# Patient Record
Sex: Female | Born: 1950 | Race: White | Hispanic: No | Smoking: Former smoker
Health system: Southern US, Community
[De-identification: ages and names within clinical notes are randomized; demographics above are authoritative.]

## PROBLEM LIST (undated history)

## (undated) DIAGNOSIS — D3613 Benign neoplasm of peripheral nerves and autonomic nervous system of lower limb, including hip: Secondary | ICD-10-CM

## (undated) DIAGNOSIS — I340 Nonrheumatic mitral (valve) insufficiency: Secondary | ICD-10-CM

## (undated) DIAGNOSIS — E039 Hypothyroidism, unspecified: Secondary | ICD-10-CM

## (undated) DIAGNOSIS — Z87442 Personal history of urinary calculi: Secondary | ICD-10-CM

## (undated) DIAGNOSIS — Q210 Ventricular septal defect: Secondary | ICD-10-CM

## (undated) DIAGNOSIS — H9319 Tinnitus, unspecified ear: Secondary | ICD-10-CM

## (undated) DIAGNOSIS — IMO0002 Reserved for concepts with insufficient information to code with codable children: Secondary | ICD-10-CM

## (undated) DIAGNOSIS — M21619 Bunion of unspecified foot: Secondary | ICD-10-CM

## (undated) DIAGNOSIS — J302 Other seasonal allergic rhinitis: Secondary | ICD-10-CM

## (undated) DIAGNOSIS — K5909 Other constipation: Secondary | ICD-10-CM

## (undated) DIAGNOSIS — K5790 Diverticulosis of intestine, part unspecified, without perforation or abscess without bleeding: Secondary | ICD-10-CM

## (undated) DIAGNOSIS — M1990 Unspecified osteoarthritis, unspecified site: Secondary | ICD-10-CM

## (undated) DIAGNOSIS — R200 Anesthesia of skin: Secondary | ICD-10-CM

## (undated) HISTORY — PX: BREAST LUMPECTOMY: SHX2

## (undated) HISTORY — DX: Diverticulosis of intestine, part unspecified, without perforation or abscess without bleeding: K57.90

## (undated) HISTORY — DX: Hypothyroidism, unspecified: E03.9

## (undated) HISTORY — DX: Reserved for concepts with insufficient information to code with codable children: IMO0002

## (undated) HISTORY — DX: Ventricular septal defect: Q21.0

## (undated) HISTORY — PX: ABDOMINAL HYSTERECTOMY: SHX81

## (undated) HISTORY — PX: SHOULDER ARTHROSCOPY: SHX128

## (undated) HISTORY — PX: TONSILLECTOMY: SUR1361

## (undated) HISTORY — DX: Other constipation: K59.09

## (undated) HISTORY — DX: Anesthesia of skin: R20.0

---

## 1994-01-29 HISTORY — PX: BREAST EXCISIONAL BIOPSY: SUR124

## 2003-02-03 ENCOUNTER — Ambulatory Visit (HOSPITAL_COMMUNITY): Admission: RE | Admit: 2003-02-03 | Discharge: 2003-02-03 | Payer: Self-pay | Admitting: Internal Medicine

## 2003-02-03 HISTORY — PX: COLONOSCOPY: SHX174

## 2003-11-22 ENCOUNTER — Encounter: Admission: RE | Admit: 2003-11-22 | Discharge: 2003-11-22 | Payer: Self-pay | Admitting: Unknown Physician Specialty

## 2003-12-28 ENCOUNTER — Ambulatory Visit: Payer: Self-pay | Admitting: Urgent Care

## 2004-02-08 ENCOUNTER — Ambulatory Visit: Payer: Self-pay | Admitting: Internal Medicine

## 2004-03-23 ENCOUNTER — Ambulatory Visit: Payer: Self-pay | Admitting: Internal Medicine

## 2004-05-09 ENCOUNTER — Ambulatory Visit: Payer: Self-pay | Admitting: Internal Medicine

## 2004-05-10 ENCOUNTER — Encounter: Admission: RE | Admit: 2004-05-10 | Discharge: 2004-05-10 | Payer: Self-pay | Admitting: Unknown Physician Specialty

## 2005-04-18 ENCOUNTER — Ambulatory Visit: Payer: Self-pay | Admitting: Cardiology

## 2005-05-14 ENCOUNTER — Encounter: Admission: RE | Admit: 2005-05-14 | Discharge: 2005-05-14 | Payer: Self-pay | Admitting: Unknown Physician Specialty

## 2006-06-12 ENCOUNTER — Encounter: Admission: RE | Admit: 2006-06-12 | Discharge: 2006-06-12 | Payer: Self-pay | Admitting: Family Medicine

## 2007-05-01 ENCOUNTER — Ambulatory Visit: Payer: Self-pay | Admitting: Internal Medicine

## 2007-06-18 ENCOUNTER — Ambulatory Visit: Payer: Self-pay | Admitting: Internal Medicine

## 2007-07-15 ENCOUNTER — Encounter: Admission: RE | Admit: 2007-07-15 | Discharge: 2007-07-15 | Payer: Self-pay | Admitting: Family Medicine

## 2007-09-03 DIAGNOSIS — R141 Gas pain: Secondary | ICD-10-CM

## 2007-09-03 DIAGNOSIS — R142 Eructation: Secondary | ICD-10-CM

## 2007-09-03 DIAGNOSIS — R110 Nausea: Secondary | ICD-10-CM

## 2007-09-03 DIAGNOSIS — E039 Hypothyroidism, unspecified: Secondary | ICD-10-CM | POA: Insufficient documentation

## 2007-09-03 DIAGNOSIS — K5900 Constipation, unspecified: Secondary | ICD-10-CM | POA: Insufficient documentation

## 2007-09-03 DIAGNOSIS — R143 Flatulence: Secondary | ICD-10-CM

## 2007-09-03 DIAGNOSIS — Z9189 Other specified personal risk factors, not elsewhere classified: Secondary | ICD-10-CM | POA: Insufficient documentation

## 2008-05-13 ENCOUNTER — Ambulatory Visit: Payer: Self-pay | Admitting: Internal Medicine

## 2008-05-19 ENCOUNTER — Ambulatory Visit: Payer: Self-pay | Admitting: Internal Medicine

## 2008-07-26 ENCOUNTER — Encounter: Admission: RE | Admit: 2008-07-26 | Discharge: 2008-07-26 | Payer: Self-pay | Admitting: Family Medicine

## 2009-08-08 ENCOUNTER — Encounter: Admission: RE | Admit: 2009-08-08 | Discharge: 2009-08-08 | Payer: Self-pay | Admitting: Family Medicine

## 2009-08-29 ENCOUNTER — Ambulatory Visit: Payer: Self-pay | Admitting: Internal Medicine

## 2009-08-30 ENCOUNTER — Ambulatory Visit: Payer: Self-pay | Admitting: Cardiology

## 2009-08-31 ENCOUNTER — Ambulatory Visit: Payer: Self-pay | Admitting: Internal Medicine

## 2010-02-28 NOTE — Assessment & Plan Note (Signed)
Summary: dropped off stool/ss   Pt returned one iFOBT and it was negative.     Allergies: No Known Drug Allergies  Other Orders: Immuno-chemical Fecal Occult (16109)  Appended Document: dropped off stool/ss Let pt know ifobt negative.  Recommend f/u as needed.  Appended Document: dropped off stool/ss pt aware

## 2010-02-28 NOTE — Assessment & Plan Note (Signed)
Summary: rx refill,bowel changes and bloating/ss   Visit Type:  Follow-up Visit Primary Care Provider:  Selinda Flavin  Chief Complaint:  Rx/ changes in BM and bloating.  History of Present Illness: [name] is here for f/u. She has h/o chronic constipation. She is here for RX refills and for recent change in BM  BM regular on Miralax. Usually two soft stools per day. For two weeks, she would feel constipated for few days and then explosive BM for few days. Stools now back to normal. Over six months in winter, am nausea. Better after BM and after meals better. Last one week not present. No change in medications. No heartburn. No melena, brbpr. Rare sharp pain in lower abd which would last for seconds. Good appetite. Weight up five pounds.   Ventricular valve defect. Going for stress test tomorrow.   Labwork last week looked good including B12.   Current Medications (verified): 1)  Singulair 10 Mg  Tabs (Montelukast Sodium) .... Once Daily 2)  Osteobiflex .... Once Daily 3)  Synthroid 75 Mcg Tabs (Levothyroxine Sodium) .... Take 1 Tablet By Mouth Once A Day 4)  Flaxseed Oil 1000 Mg  Caps (Flaxseed (Linseed)) .... Take 1 Tablet By Mouth Once A Day 5)  Calcium D .... Once Daily 6)  Miralax .... Twice Every Other Day, Once The Ohter Days 7)  Sustenex .... Once Daily 8)  Multi-Viatmin .... Once Daily 9)  Zyrtec Allergy 10 Mg Tabs (Cetirizine Hcl) .... Take 1 Tablet By Mouth Once A Day 10)  Vitamin E .... Once Daily 11)  Fish Oil 1200 Mg .... One Tablet Daily 12)  Adrenal Support .... One Tablet 3 Times  Daily  Allergies (verified): No Known Drug Allergies  Past History:  Past Medical History: Hypothyroidism Allergies Chronic constipation  Family History: Father: Deceased @ 42  Result of a fall Mother: Deceased @ 69 Parkinson's Siblings: 2 One deceased  unknown No FH of CRC.  Sister, colon polyps, late 29s.  Social History: Marital Status: Married Children: 3 Occupation:  Part time [employer], basically retired. Nonsmoker. Rare wine.  Review of Systems      See HPI GU:  Denies urinary burning and blood in urine.  Vital Signs:  Patient profile:   60 year old female Height:      62 inches Weight:      125 pounds BMI:     22.95 Temp:     97.8 degrees F oral Pulse rate:   80 / minute BP sitting:   100 / 74  (left arm) Cuff size:   regular  Vitals Entered By: Cloria Spring LPN (August 29, 2009 10:32 AM)  Physical Exam  General:  Well developed, well nourished, no acute distress. Head:  Normocephalic and atraumatic. Eyes:  sclera nonicteric Mouth:  op moist Lungs:  Clear throughout to auscultation. Heart:  Regular rate and rhythm; no murmurs, rubs,  or bruits. Abdomen:  Bowel sounds normal.  Abdomen is soft, nontender, nondistended.  No rebound or guarding.  No hepatosplenomegaly, masses or hernias.  No abdominal bruits.  Extremities:  No clubbing, cyanosis, edema or deformities noted. Neurologic:  Alert and  oriented x4;  grossly normal neurologically. Skin:  Intact without significant lesions or rashes. Psych:  Alert and cooperative. Normal mood and affect.  Impression & Recommendations:  Problem # 1:  Hx of CONSTIPATION (ICD-564.00)  Chronic constipation controlled with Miralax. Two week h/o more constipation followed by loose explosive stool. Last TCS 2005, due for  f/u 2015. No blood in stool. No need for TCS at this time. Would advise she continue to monitor BM. If she has change in bowel habits that is prolonged then she will let me know. Will check ifobt. Request labs from PCP for review.  Orders: Est. Patient Level II (16109)  Problem # 2:  Hx of NAUSEA (ICD-787.02)  Vague am nausea for past six months. Improved with eating and BM. ?significance. Trial of PPI at night if symptoms return. Samples of Prilosec OTC #30 given. IFOBT. She will call if symptoms do not respond to PPI.   Orders: Est. Patient Level II  (60454) Prescriptions: POLYETHYLENE GLYCOL 3350  POWD (POLYETHYLENE GLYCOL 3350) 17 grams by mouth daily, alternating with 17 grams by mouth bid  #1054 grams x 11   Entered and Authorized by:   Leanna Battles. Dixon Boos   Signed by:   Leanna Battles Lis Savitt PA-C on 08/29/2009   Method used:   Electronically to        [facility]  [address] [address] [id number] (retail)       [address]       [place], [place]  [address]       Ph: [phone] or [phone]       Fax: [phone]   RxID:   [id number]   Appended Document: rx refill,bowel changes and bloating/ss Reviewed labs 7/11 at PCP: B12 649, Folate >19.9, LFTs normal, Met-7, WBC 4.4, H/H 13.7/41.1, MCV 99, Plt 208,000, TSH 1.890

## 2010-06-13 NOTE — Assessment & Plan Note (Signed)
[name], [name]                 CHART#:  [id number]   DATE:  05/01/2007                       DOB:  12-Jun-1950   REASON FOR CONSULTATION:  Abdominal pain, bloating, nausea.   REASON FOR VISIT:  A 60 year old lady with abdominal pain, constipation,  has had some increased difficulties with bloating and nausea and  rumbling, which is loud enough which makes her hesitant to go to  church, and some early-morning nausea these days.  She has been taking  MiraLax 17 g orally in the morning regularly for constipation, but she  does, basically, move her bowels a couple of times daily.  On imaging  studies previously, she has always had a large stool load in her colon.  She took a brief course of Prilosec and probiotic therapy recently, and  the bloating and nausea have actually significantly improved.  She has  not had any melena or hematochezia.  Her weight has been stable.  She  gets nauseous, but she never vomits.  Her bowel function has been such  as it is all of her life.  There is no family history of GI neoplasia.  She has had some intermittent left lower quadrant abdominal pain for  years, this was evaluated fairly extensively back in 2005.  She has been  on a variety of agents for constipation.  She has a history of  hypothyroidism, but she tells me Dr. Dimas Aguas feels that she is adequately  replaced these days.  She underwent a colonoscopy, largely for  screening, in January of 2005.  She was found to have a normal rectum  and colon.   PAST MEDICAL HISTORY:  1. Hypothyroidism.  2. Allergies.   PAST SURGERIES:  1. Hysterectomy.  2. Lumpectomy.  3. Tonsillectomy.   She did see her gynecologist recently and did a vaginal ultrasound.  Left ovary difficult to see because of the large amount of stool in her  rectum and left colon.   MEDICATIONS:  See updated list.   ALLERGIES:  No known drug allergies.   SOCIAL HISTORY:  The patient is married.  She rarely has a glass of  wine, no tobacco, no illicit drugs.   REVIEW OF SYSTEMS:  No chest pain, no dyspnea on exertion, no change in  weight, no fever or chills.  No odynophagia, dysphagia, or reflux  symptoms.   PHYSICAL EXAMINATION:  A pleasant 60 year old lady who appears at her  baseline weight 123, height 5 feet 2 inches, temp 98, BP 190/60 and  pulse 56.  SKIN:  Warm and dry.  There is no jaundice.  CHEST:  Lungs are clear to auscultation.  CARDIAC EXAM:  Regular rate and rhythm without murmur, gallop, or rub.  BREAST EXAM:  Deferred.  ABDOMEN:  Flat, positive bowel sounds, soft, really nontender to  palpation.  No appreciable mass or organomegaly.  EXTREMITIES:  Show no edema.  RECTAL EXAM:  No external lesions.  Digital exam reveals good tone.  She  has quite a bit of soft stool in proximal rectum.  No mass.  Stool is  Hemoccult negative.   IMPRESSION:  The patient is a very pleasant 60 year old lady with an  element of chronic left-sided abdominal pain who has had some self-  limiting bloating and nausea recently, although she did take a course of  acid suppression and probiotic therapy, which, at least, is temporarily  related to improvement in those symptoms.   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  I would like to see if we  cannot get her colon better evacuated on a regular basis, and see if we  cannot, overall, get her feeling better.   RECOMMENDATIONS:  I have told the patient if she takes a single dose of  MiraLax daily, I would like her to take it at bedtime, but for the time  being I would actually like to increase the dose to 17 g orally twice  daily and see how this works for her.  It should increase volume and  number of stools somewhat, and see if we can get her feeling better.   I do not feel that any laboratory studies or diagnostic  studies,  otherwise, are indicated at this time.  Will plan to see her back in the  office and reevaluate in the near future.   She is to keep a stool diary.       Jonathon Bellows, M.D.  Electronically Signed     RMR/MEDQ  D:  05/02/2007  T:  05/02/2007  Job:  [id number]   cc:   Selinda Flavin

## 2010-06-13 NOTE — Assessment & Plan Note (Signed)
NAME:  RAEDEN, BELZER                 CHART#:  [id number]   DATE:                                   DOB:  1950/08/29   SUBJECTIVE:  [name] presented with recurrent left-sided abdominal  pain back on May 01, 2007.  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  We got her back on a regimen of MiraLax  once nightly when she was seen.  On May 02, 2007, she called in and  stated that she was about 25% better.  She tried MiraLax twice daily and  it was a little bit too much.  She dropped back to once daily but will  go two to three days without a bowel movement and still had some  abdominal bloating, but it was improved.  I added the second dose every  third day, to see if we could facilitate normalization of bowel  function.  She is here today, pleased that the abdominal bloating and  left-sided abdominal pain were much better.  She has one to two bowel  movements daily and feels that her overall stool volume has picked up,  although she is not having any diarrhea.  She assessed herself as being  over 50% improved.  She was started on Sustenex daily as a probiotic as  well, which may also be improving the situation.  She has not had any  rectal bleeding and overall feels much better.  She wishes she could  have completely normal bowel function.  Her weight is down 2 pounds  since she was last seen.   CURRENT MEDICATIONS:  See the updated list.   ALLERGIES:  No known drug allergies.   PHYSICAL EXAMINATION:  GENERAL:  She appears very well, a well-groomed  lady, in no acute distress.  VITAL SIGNS:  Weight 121 pounds, height 5 feet 2 inches, temperature  97.7 degrees, blood pressure 118/80, pulse 60.  ABDOMEN:  Nondistended.  Positive bowel sounds, soft, entirely nontender  without appreciable mass or organomegaly.   ASSESSMENT:  Slow transit constipation/colonic inertia, much improved  with the current regimen of MiraLax.   PLAN:  We could probably try to titrate  further, to ideally meet the  challenges of her slow colon.  I told Ms. Schmiesing I will give her the  liberty to go ahead and take a second dose of MiraLax every other day  instead of every third day, and see if that won't additionally  facilitate normalization of bowel function and minimization of her  current GI symptoms.  If this turns out to be too much, as a regular  twice daily regimen, she can back off to the second dose every third  day.   FOLLOWUP:  I would like to see this nice lady back in three months and  see how she is doing.       Jonathon Bellows, M.D.  Electronically Signed     RMR/MEDQ  D:  06/18/2007  T:  06/18/2007  Job:  [id number]   cc:   Lonzo Cloud

## 2010-06-16 NOTE — Op Note (Signed)
NAME:  [name], [name]                          ACCOUNT NO.:  [id number]   MEDICAL RECORD NO.:  [id number]                   PATIENT TYPE:  AMB   LOCATION:  DAY                                  FACILITY:  [facility]   PHYSICIAN:  R. Roetta Sessions, M.D.              DATE OF BIRTH:  26-Dec-1950   DATE OF PROCEDURE:  02/03/2003  DATE OF DISCHARGE:                                 OPERATIVE REPORT   PROCEDURE:  Diagnostic colonoscopy.   ENDOSCOPIST:  Gerrit Friends. Rourk, M.D.   INDICATIONS FOR PROCEDURE:  The patient is a 60 year old lady with a 2-year  history of left lower quadrant abdominal pain.  She has not had prior  colorectal cancer screening.  She is sent over at the courtesy of Dr. Mora Appl  in [place].  Colonoscopy is now being performed. This approach has been  discussed with the patient at length previously.  The potential risks,  benefits, and alternatives have been reviewed; questions answered.  She is  agreeable.  Please see my dictated consultation of January 25, 2003 for  more information.   PROCEDURE NOTE:  O2 saturation, blood pressure, pulse and respirations were  monitored throughout the entire procedure.  Conscious sedation: Versed 4 mg IV, Demerol 50 mg IV, ampicillin 2 gm IV,  gentamicin IV prior to the procedure.   INSTRUMENT:  Olympus video chip pediatric colonoscope.   FINDINGS:  Digital rectal exam revealed no abnormalities.   ENDOSCOPIC FINDINGS:  The prep was good.   RECTUM:  Examination of the rectal mucosa including the retroflex view of  the anal verge revealed no abnormalities.   COLON:  The colonic mucosa was surveyed from the rectosigmoid junction  through the left transverse and right colon to the area of the appendiceal  orifice, ileocecal valve, and cecum.  These structures were well seen and  photographed for the record.   From this level the scope was slowly withdrawn.  All previously mentioned  mucosal surfaces were again seen.  No other colonic  mucosal abnormalities  were observed.  The patient tolerated the procedure well and was reacted in  endoscopy.   IMPRESSION:  1. Normal rectum.  2. Normal colon.   RECOMMENDATIONS:  1. No endoscopic explanation for the patient's symptoms.  I understand that     she has not had a CT scan, as of yet, this really needs to be done.  2. We will proceed with an abdominal pelvic CT scan in the near future.  3. Further recommendations to follow.  4. As well as colorectal cancer screening is concerned, with no family     history she is to consider having another colonoscopy in 10 years.      ___________________________________________  Jonathon Bellows, M.D.   RMR/MEDQ  D:  02/03/2003  T:  02/03/2003  Job:  [id number]   cc:   Ruthy Dick  [address]  [place]  [place] [address]  Fax: [phone]

## 2010-06-16 NOTE — H&P (Signed)
NAME:  [name], [name] NO.:  [id number]   MEDICAL RECORD NO.:  [id number]                  PATIENT TYPE:   LOCATION:                                       FACILITY:   PHYSICIAN:  R. Roetta Sessions, M.D.              DATE OF BIRTH:  10-Dec-1950   DATE OF ADMISSION:  01/25/2003  DATE OF DISCHARGE:                                HISTORY & PHYSICAL   CHIEF COMPLAINT:  Left lower quadrant pain.   HPI:  [name] is a 60 year old Caucasian female who presented to  our office for evaluation of left lower quadrant abdominal pain for two  years.  [name] states that she has had twinges of increasing soreness  in her left lower quadrant.  This has been intermittent over the last two  years, however progressively over the last three months it has gotten worse.  It usually is present every day and may disappear for an hour or two  intermittently during the day.  She also complains of nausea without any  emesis.  She denies any diarrhea.  She does report occasional constipation,  usually once or twice a month, and has taken laxatives three times last  year.  She reports bowel movements every day or every other day without any  hematochezia or melena.  She was seen by Dr. Gilford [name] and is status post  partial hysterectomy in 1981.  She also states that CA-125 was checked which  was normal.  Of note, she denies any upper GI symptoms including heartburn,  indigestion or history of GERD or peptic ulcer disease.   PAST MEDICAL HISTORY:  1. SEASONAL ALLERGIES.  2. Anxiety.  3. Congenital heart defect with murmur.  4. Hormone replacement therapy.   PAST SURGICAL HISTORY:  1. Partial hysterectomy in 1981.  2. Tonsillectomy at age 6.   CURRENT MEDICATIONS:  1. OsteoBiflex daily.  2. Vitamin E and C daily.  3. Allergy injections twice a week.  4. Multivitamin.  5. Motrin 100 mg daily.  6. Flonase daily.  7. Singulair 10 mg daily.  8. Cenestin 0.625 mg  daily.   ALLERGIES:  No known drug allergies.   FAMILY HISTORY:  No known family history of colorectal carcinoma, liver or  chronic GI problems.  Father deceased at age 39 and did have a history of  gastric carcinoma diagnosed at age 31.  Mother is age 73 and in fair health.  She has one sister alive and one brother deceased secondary to unknown  cause.   SOCIAL HISTORY:  [name] has been married for 35 years and has three  grown healthy children.  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  She currently does not smoke and reports remote  history of three years of tobacco use.  She currently consumes one to two  glasses of wine per month.  She denies any drug use.   REVIEW OF SYSTEMS:  CONSTITUTIONAL:  Weight is stable, appetite is good.  Denies any fever or chills.  Denies any fatigue.  CARDIOVASCULAR:  No chest  pain or palpitations.  PULMONARY:  No history of asthma or COPD.  Denies any  shortness of breath, dyspnea or hemoptysis.  SKIN:  Denies any rash or  jaundice.  HEME:  Denies any history of anemia or blood dyscrasias.  GI:  See HPI.  Also denies any dysphagia or odynophagia.  ENDOCRINE:  Denies any  history of diabetes mellitus or thyroid problems.   PHYSICAL EXAMINATION:  VITAL SIGNS:  Weight of 119 pounds, height 62 inches,  temp 97.4, blood pressure 130/82, pulse 54.  GENERAL:  [name] is a 60 year old Caucasian female who is well-  developed, well-nourished, in no apparent distress.  She is alert, oriented,  pleasant and cooperative.  HEENT:  Sclera clear, nonicteric.  Conjunctiva pink.  Throat is pink and  moist without any lesions.  NECK:  Supple without any mass or thyromegaly.  CHEST:  Heart regular rate and rhythm with a 3/6 murmur.  LUNGS:  Clear to auscultation bilaterally.  ABDOMEN:  Flat with positive bowel sounds x 4.  Soft, nontender,  nondistended.  No palpable organomegaly or mass.  EXTREMITIES:  2+ pupils  bilaterally, no edema.  SKIN:  Pink, warm and dry without any rash or jaundice.   ASSESSMENT:  [name] is a 60 year old Caucasian female with persistent  left lower quadrant pain of approximately two years now:  Definitely  worrisome as [name] has not had screening colonoscopy and is age 89.  Would recommend colonoscopy at this point in time to rule out colon cancer.  Would consider other diagnoses including diverticulitis or atypical  irritable bowel syndrome with constipation if colonoscopy is negative.   RECOMMENDATIONS:  1. Will schedule screening colonoscopy to be performed by Dr. Jena Gauss.  I have     discussed this procedure including risks and benefits which include but     are not limited to bleeding, perforation and infection with [name].     She agrees with this plan.  We will also give SBE prophylaxis given her     history of congenital heart defect and murmur.  2. Labs today did include CBC and sed rate.  3. Recommended over-the-counter stool softeners as well as fiber     supplementation to increase stool to bulk help decrease incidence of     constipation.   We would like to thank Dr. Mora Appl for allowing [place] to participate in the care  of [name].     _____________________________________  ___________________________________________  Nicholas Lose, N.P.               Jonathon Bellows, M.D.   KC/MEDQ  D:  01/25/2003  T:  01/25/2003  Job:  [id number]   cc:   R. Roetta Sessions, M.D.  [address]  [place]  [place] [address]  Fax: [phone]   Dr. Mora Appl  [facility]

## 2010-07-17 ENCOUNTER — Other Ambulatory Visit: Payer: Self-pay | Admitting: Family Medicine

## 2010-07-17 DIAGNOSIS — Z1231 Encounter for screening mammogram for malignant neoplasm of breast: Secondary | ICD-10-CM

## 2010-08-22 ENCOUNTER — Ambulatory Visit
Admission: RE | Admit: 2010-08-22 | Discharge: 2010-08-22 | Disposition: A | Discharge status: Home / Self Care | Payer: BC Managed Care – PPO | Source: Ambulatory Visit | Attending: Family Medicine | Admitting: Family Medicine

## 2010-08-22 DIAGNOSIS — Z1231 Encounter for screening mammogram for malignant neoplasm of breast: Secondary | ICD-10-CM

## 2010-08-23 ENCOUNTER — Ambulatory Visit: Payer: Self-pay

## 2010-09-15 ENCOUNTER — Other Ambulatory Visit: Payer: Self-pay | Admitting: Gastroenterology

## 2011-07-27 ENCOUNTER — Other Ambulatory Visit: Payer: Self-pay | Admitting: Family Medicine

## 2011-07-27 DIAGNOSIS — Z1231 Encounter for screening mammogram for malignant neoplasm of breast: Secondary | ICD-10-CM

## 2011-08-28 ENCOUNTER — Ambulatory Visit: Payer: BC Managed Care – PPO

## 2011-09-10 ENCOUNTER — Ambulatory Visit
Admission: RE | Admit: 2011-09-10 | Discharge: 2011-09-10 | Disposition: A | Discharge status: Home / Self Care | Payer: BC Managed Care – PPO | Source: Ambulatory Visit | Attending: Family Medicine | Admitting: Family Medicine

## 2011-09-10 DIAGNOSIS — Z1231 Encounter for screening mammogram for malignant neoplasm of breast: Secondary | ICD-10-CM

## 2011-10-15 ENCOUNTER — Other Ambulatory Visit: Payer: Self-pay | Admitting: Internal Medicine

## 2011-10-15 NOTE — Telephone Encounter (Signed)
LMOM to call back

## 2011-10-15 NOTE — Telephone Encounter (Signed)
Please let pt know she needs OV prior to further RFs as not seen in 2 years.  She can get from PCP otherwise.

## 2011-12-11 ENCOUNTER — Ambulatory Visit: Payer: BC Managed Care – PPO | Admitting: Urgent Care

## 2011-12-13 ENCOUNTER — Ambulatory Visit (INDEPENDENT_AMBULATORY_CARE_PROVIDER_SITE_OTHER): Payer: BC Managed Care – PPO | Admitting: Gastroenterology

## 2011-12-13 ENCOUNTER — Encounter: Payer: Self-pay | Admitting: Gastroenterology

## 2011-12-13 VITALS — BP 120/60 | HR 65 | Temp 98.2°F | Ht 62.0 in | Wt 127.8 lb

## 2011-12-13 DIAGNOSIS — K5900 Constipation, unspecified: Secondary | ICD-10-CM

## 2011-12-13 MED ORDER — LUBIPROSTONE 8 MCG PO CAPS: 8.0000 ug | ORAL_CAPSULE | Freq: Two times a day (BID) | ORAL | Status: DC

## 2011-12-13 MED ORDER — POLYETHYLENE GLYCOL 3350 17 GM/SCOOP PO POWD: 17.0000 g | Freq: Every day | ORAL | Status: DC

## 2011-12-13 NOTE — Patient Instructions (Addendum)
Start taking Amitiza each night with food. You may increase this to twice a day with a meal if it works well. We have provided samples to try.   Please call us back in a week to let us know how you are doing.   We will see you back in 2 years or sooner if necessary. We will set you up for a colonoscopy at that time.

## 2011-12-13 NOTE — Progress Notes (Signed)
.  Primary Care Physician:  Selinda Flavin, MD Primary GI: Dr. Jena Gauss   Chief Complaint  Patient presents with  . Medication Refill    HPI:   [name] is a 61 year old female who presents today for a 2 year f/u with hx of chronic constipation. Last TCS in 2005, due for screening in 2015. Notes taking Miralax daily, sometimes twice a day. Sometimes bloated, sometimes feels not as productive as it should. She then increases the Miralax to twice a day. Trying to eat a high fiber diet, doesn't utilize fiber supplements. No blood in stool. No wt loss or lack of appetite. No reflux, no nausea. Taking a probiotic daily  Past Medical History  Diagnosis Date  . Hypothyroidism   . Chronic constipation     Past Surgical History  Procedure Date  . Abdominal hysterectomy   . Breast lumpectomy   . Tonsillectomy   . Colonoscopy  02/03/2003    RMR: Normal rectum and colon    Current Outpatient Prescriptions  Medication Sig Dispense Refill  . ALPRAZolam (XANAX) 0.25 MG tablet Take 0.25 mg by mouth at bedtime as needed.       . CENESTIN 0.625 MG tablet Take 0.625 mg by mouth daily.       . fish oil-omega-3 fatty acids 1000 MG capsule Take 2 g by mouth daily.      . Flaxseed, Linseed, (FLAX SEED OIL PO) Take by mouth.      . levocetirizine (XYZAL) 5 MG tablet Take 5 mg by mouth every evening.       Marland Kitchen levothyroxine (SYNTHROID, LEVOTHROID) 75 MCG tablet Take 75 mcg by mouth daily.       . montelukast (SINGULAIR) 10 MG tablet Take 10 mg by mouth at bedtime.       . Multiple Vitamin (MULTIVITAMIN) capsule Take 1 capsule by mouth daily.      . polyethylene glycol powder (GLYCOLAX/MIRALAX) powder Take 17 g by mouth daily.  255 g  11  . vitamin B-12 (CYANOCOBALAMIN) 1000 MCG tablet Take 1,000 mcg by mouth daily.      . Vitamin D, Ergocalciferol, (DRISDOL) 50000 UNITS CAPS Take 50,000 Units by mouth.      . lubiprostone (AMITIZA) 8 MCG capsule Take 1 capsule (8 mcg total) by mouth 2 (two) times daily  with a meal.  14 capsule  0    Allergies as of 12/13/2011 - Review Complete 12/13/2011  Allergen Reaction Noted  . Sulfa antibiotics Rash 12/13/2011    Family History  Problem Relation Age of Onset  . Colon cancer Neg Hx     History   Social History  . Marital Status: Married    Spouse Name: N/A    Number of Children: N/A  . Years of Education: N/A   Occupational History  . retired    Social History Main Topics  . Smoking status: Former Smoker -- 0.5 packs/day    Types: Cigarettes  . Smokeless tobacco: None     Comment: smoked in her 5s  . Alcohol Use: Yes     Comment: socially  . Drug Use: No  . Sexually Active: None   Other Topics Concern  . None   Social History Narrative  . None    Review of Systems: Gen: Denies fever, chills, anorexia. Denies fatigue, weakness, weight loss.  CV: Denies chest pain, palpitations, syncope, peripheral edema, and claudication. Resp: Denies dyspnea at rest, cough, wheezing, coughing up blood, and pleurisy. GI: Denies vomiting blood, jaundice,  and fecal incontinence.   Denies dysphagia or odynophagia. Derm: Denies rash, itching, dry skin Psych: Denies depression, anxiety, memory loss, confusion. No homicidal or suicidal ideation.  Heme: Denies bruising, bleeding, and enlarged lymph nodes.  Physical Exam: BP 120/60  Pulse 65  Temp 98.2 F (36.8 C) (Temporal)  Ht 5\' 2"  (1.575 m)  Wt 127 lb 12.8 oz (57.97 kg)  BMI 23.38 kg/m2 General:   Alert and oriented. No distress noted. Pleasant and cooperative.  Head:  Normocephalic and atraumatic. Eyes:  Conjuctiva clear without scleral icterus. Mouth:  Oral mucosa pink and moist. Good dentition. No lesions. Neck:  Supple, without mass or thyromegaly. Heart:  S1, S2 present without murmurs, rubs, or gallops. Regular rate and rhythm. Abdomen:  +BS, soft, non-tender and non-distended. No rebound or guarding. No HSM or masses noted. Msk:  Symmetrical without gross deformities. Normal  posture. Extremities:  Without edema. Neurologic:  Alert and  oriented x4;  grossly normal neurologically. Skin:  Intact without significant lesions or rashes. Cervical Nodes:  No significant cervical adenopathy. Psych:  Alert and cooperative. Normal mood and affect.

## 2011-12-16 ENCOUNTER — Encounter: Payer: Self-pay | Admitting: Gastroenterology

## 2011-12-16 NOTE — Assessment & Plan Note (Signed)
61 year old female with hx of chronic constipation, due for screening colonoscopy in 2015. Routine follow-up today for refills. Denies any rectal bleeding. Notes occasional bloating, feelings of incomplete evacuation. Miralax 1-2 times daily. Will trial Amitiza 8 mcg daily and increase to BID if improvement. Pt to call in 1 week with PR.   Otherwise, return in 2015 to set up TCS.

## 2011-12-17 ENCOUNTER — Telehealth: Payer: Self-pay | Admitting: Internal Medicine

## 2011-12-17 NOTE — Telephone Encounter (Signed)
Pt was seen recently and was asking to speak with LSL regarding her medications. Please call her at [phone] before 1230 if possible or either later this afternoon

## 2011-12-17 NOTE — Telephone Encounter (Signed)
Pt saw AS at her ov, not LSL. Pt stated she has been taking the amitiza qd and it doesn't seem to be working and she only had nausea 1 time since she started taking it. Advised pt to increase to bid per AS ov note and let us know how its working and make sure she takes it with food.

## 2011-12-17 NOTE — Progress Notes (Signed)
Faxed to PCP

## 2011-12-19 NOTE — Telephone Encounter (Signed)
Agree 

## 2011-12-25 ENCOUNTER — Other Ambulatory Visit: Payer: Self-pay | Admitting: Gastroenterology

## 2012-01-07 ENCOUNTER — Other Ambulatory Visit: Payer: Self-pay

## 2012-01-07 MED ORDER — POLYETHYLENE GLYCOL 3350 17 GM/SCOOP PO POWD: 17.0000 g | Freq: Every day | ORAL | Status: DC

## 2012-08-13 ENCOUNTER — Other Ambulatory Visit: Payer: Self-pay

## 2012-08-13 DIAGNOSIS — Z1231 Encounter for screening mammogram for malignant neoplasm of breast: Secondary | ICD-10-CM

## 2012-09-10 ENCOUNTER — Ambulatory Visit
Admission: RE | Admit: 2012-09-10 | Discharge: 2012-09-10 | Disposition: A | Discharge status: Home / Self Care | Payer: BC Managed Care – PPO | Source: Ambulatory Visit

## 2012-09-10 DIAGNOSIS — Z1231 Encounter for screening mammogram for malignant neoplasm of breast: Secondary | ICD-10-CM

## 2012-10-21 ENCOUNTER — Encounter: Payer: Self-pay | Admitting: Cardiology

## 2012-10-22 ENCOUNTER — Ambulatory Visit (INDEPENDENT_AMBULATORY_CARE_PROVIDER_SITE_OTHER): Payer: BC Managed Care – PPO | Admitting: Cardiology

## 2012-10-22 ENCOUNTER — Encounter: Payer: Self-pay | Admitting: Cardiology

## 2012-10-22 VITALS — BP 144/82 | HR 59 | Ht 62.0 in | Wt 130.1 lb

## 2012-10-22 DIAGNOSIS — Q210 Ventricular septal defect: Secondary | ICD-10-CM

## 2012-10-22 DIAGNOSIS — E039 Hypothyroidism, unspecified: Secondary | ICD-10-CM

## 2012-10-22 NOTE — Patient Instructions (Addendum)
Your physician recommends that you schedule a follow-up appointment in: 1 year. You will receive a reminder letter in the mail in about 10 months reminding you to call and schedule your appointment. If you don't receive this letter, please contact our office. Your physician recommends that you continue on your current medications as directed. Please refer to the Current Medication list given to you today. Your physician has requested that you have an echocardiogram in 1 year just before your next visit. Echocardiography is a painless test that uses sound waves to create images of your heart. It provides your doctor with information about the size and shape of your heart and how well your heart's chambers and valves are working. This procedure takes approximately one hour. There are no restrictions for this procedure.  

## 2012-10-22 NOTE — Progress Notes (Signed)
Clinical Summary [name] is a 62 y.o.female referred for cardiology consultation by Dr. Dimas Aguas. She is the mother of Dr. Egbert Garibaldi, Pulmonologist in [place]. History reviewed below, including prior documentation of a small membranous VSD by echocardiogram in 2011. She reports long-standing cardiac murmur as expected, has been aware that she had a "hole in her heart" since childhood. She has never required any specific intervention for this. She reports no functional limitations.  Dr. Jeannette How records indicate prior documentation of cardiac murmur over the years. Recent echocardiogram read by the [facility] reported normal LV wall thickness with LVEF 60-65%, grade 1 diastolic dysfunction, normal RV size and contraction, membranous VSD with left to right shunting, normal right atrial chamber size, no definite ASD or PFO, trace aortic regurgitation, mild tricuspid regurgitation with RVSP 50-55 mm mercury, no pericardial effusion.  ECG today shows sinus bradycardia.  She states that she exercises 2 hours a day, walks for an hour, and then goes to the [facility] for water aerobics and weights. She reports NYHA class I dyspnea, no chest pain or palpitations. She has no prior history of endocarditis.  Allergies  Allergen Reactions  . Sulfa Antibiotics Rash    Current Outpatient Prescriptions  Medication Sig Dispense Refill  . ALPRAZolam (XANAX) 0.25 MG tablet Take 0.25 mg by mouth at bedtime as needed.       . CENESTIN 0.625 MG tablet Take 0.625 mg by mouth daily.       . fish oil-omega-3 fatty acids 1000 MG capsule Take 2 g by mouth daily.      Marland Kitchen levocetirizine (XYZAL) 5 MG tablet Take 5 mg by mouth every evening.      Marland Kitchen levothyroxine (SYNTHROID, LEVOTHROID) 75 MCG tablet Take 75 mcg by mouth daily.       . montelukast (SINGULAIR) 10 MG tablet Take 10 mg by mouth at bedtime.       . Multiple Vitamin (MULTIVITAMIN) capsule Take 1 capsule by mouth daily.      . NON FORMULARY Take 1 tablet by mouth  daily. Curamin      . NON FORMULARY Take 2 tablets by mouth daily. Joint Bulider      . PATANASE 0.6 % SOLN Place 1 drop into the nose as needed.       . polyethylene glycol powder (GLYCOLAX/MIRALAX) powder Take 17 g by mouth daily.  527 g  11   No current facility-administered medications for this visit.    Past Medical History  Diagnosis Date  . Hypothyroidism   . Chronic constipation   . Ventricular septal defect (VSD), membranous     Documented by echocardiogram 2011  . Secondary pulmonary hypertension     PASP 40 mmHg 2011    Past Surgical History  Procedure Laterality Date  . Abdominal hysterectomy    . Breast lumpectomy    . Tonsillectomy    . Colonoscopy   02/03/2003    RMR: Normal rectum and colon    Family History  Problem Relation Age of Onset  . Colon cancer Neg Hx     Social History Ms. Gargus reports that she has quit smoking. Her smoking use included Cigarettes. She smoked 0.50 packs per day. She does not have any smokeless tobacco history on file. Ms. Gibeault reports that  drinks alcohol.  Review of Systems Occasional leg cramps . Negative except as outlined above.   Physical Examination Filed Vitals:   10/22/12 1444  BP: 144/82  Pulse: 59   Filed Weights  10/22/12 1444  Weight: 130 lb 1.9 oz (59.022 kg)   Normally nourished appearing woman, comfortable at rest. HEENT: Conjunctiva and lids normal, oropharynx clear with moist mucosa. Neck: Supple, no elevated JVP or carotid bruits, no thyromegaly. Lungs: Clear to auscultation, nonlabored breathing at rest. Cardiac: Regular rate and rhythm, no S3, 3/6 holosystolic murmur at the base, no diastolic murmur, no pericardial rub. Abdomen: Soft, nontender, bowel sounds present. Extremities: No pitting edema, distal pulses 2+. Skin: Warm and dry. Musculoskeletal: No kyphosis. Neuropsychiatric: Alert and oriented x3, affect grossly appropriate.   Problem List and Plan   Ventricular septal defect  (VSD), membranous Recent echocardiogram report reviewed. She has had prior documentation of this based on echocardiogram from 2011 and a longstanding murmur. She reports no functional limitations, has only trivial aortic regurgitation, no history of endocarditis. Pulmonary pressures are moderately increased, somewhat higher than last assessment, although her right heart size and function are normal. At this point would recommend observation, she does not need SBE prophylaxis by guidelines. It is not entirely clear to me that the increased pulmonary pressures are purely related to the VSD, she also reports problems with allergies and intermittent wheezing. It would be a good idea for her to have PFTs at some point if not already obtained. Will plan to see her back in one year with an echocardiogram.  HYPOTHYROIDISM Keep follow up with Dr. Dimas Aguas.    Jonelle Sidle, M.D., F.A.C.C.

## 2012-10-22 NOTE — Assessment & Plan Note (Signed)
Recent echocardiogram report reviewed. She has had prior documentation of this based on echocardiogram from 2011 and a longstanding murmur. She reports no functional limitations, has only trivial aortic regurgitation, no history of endocarditis. Pulmonary pressures are moderately increased, somewhat higher than last assessment, although her right heart size and function are normal. At this point would recommend observation, she does not need SBE prophylaxis by guidelines. It is not entirely clear to me that the increased pulmonary pressures are purely related to the VSD, she also reports problems with allergies and intermittent wheezing. It would be a good idea for her to have PFTs at some point if not already obtained. Will plan to see her back in one year with an echocardiogram.

## 2012-10-22 NOTE — Assessment & Plan Note (Signed)
Keep followup with Dr. Howard. 

## 2012-12-23 ENCOUNTER — Encounter (INDEPENDENT_AMBULATORY_CARE_PROVIDER_SITE_OTHER): Payer: Self-pay

## 2012-12-23 ENCOUNTER — Encounter: Payer: Self-pay | Admitting: Internal Medicine

## 2012-12-23 ENCOUNTER — Ambulatory Visit (INDEPENDENT_AMBULATORY_CARE_PROVIDER_SITE_OTHER): Payer: BC Managed Care – PPO | Admitting: Internal Medicine

## 2012-12-23 VITALS — BP 127/83 | HR 54 | Temp 97.6°F | Wt 130.0 lb

## 2012-12-23 DIAGNOSIS — K5900 Constipation, unspecified: Secondary | ICD-10-CM

## 2012-12-23 DIAGNOSIS — R109 Unspecified abdominal pain: Secondary | ICD-10-CM

## 2012-12-23 NOTE — Patient Instructions (Signed)
Will obtain records from Dr. Jeannette How office regarding recent lab work  Contrast CT of the abdomen and pelvis to further evaluate intermittent abdominal pain  Colonoscopy tentatively planned for 2015.  Continue MiraLax once to twice daily as needed to facilitate a reasonably good bowel movement once daily  Further recommendations to follow

## 2012-12-23 NOTE — Progress Notes (Signed)
Primary Care Physician:  Selinda Flavin, MD Primary Gastroenterologist:  Dr.   Pre-Procedure History & Physical: HPI:  [name] is a 62 y.o. female here for followup of chronic bloating constipation. Constipation managed well with MiraLax one dose twice daily. Do not feel him to use at work at all and stopped taking it after a couple of weeks. Titrating MiraLax is associated with one recently good bowel movement every day. The past 3 months she's had twinges of bandlike abdominal pain at the level of the umbilicus. Not associated with eating, fasting or having a bowel movement. May last seconds to upwards of 5 minutes sometimes it is fairly severe and stops her from her activities. Not associated with nausea or vomiting. Has any fever chills. Gallbladder remains in situ. No NSAIDs. She feels that the frequency may be somewhat increasing. Saw Dr. Dimas Aguas a month or so ago and had an extensive lab work. I do not have copies this time. Weight is actually up 3 pounds since her last visit one year ago. She'll be due for average risk colorectal cancer screening next year. She is seen regularly for her cardiopulmonary issues by Dr. Diona Browner.  Past Medical History  Diagnosis Date  . Hypothyroidism   . Chronic constipation   . Ventricular septal defect (VSD), membranous     Documented by echocardiogram 2011  . Secondary pulmonary hypertension     PASP 40 mmHg 2011    Past Surgical History  Procedure Laterality Date  . Abdominal hysterectomy    . Breast lumpectomy    . Tonsillectomy    . Colonoscopy   02/03/2003    RMR: Normal rectum and colon    Prior to Admission medications   Medication Sig Start Date End Date Taking? Authorizing Provider  CENESTIN 0.625 MG tablet Take 0.625 mg by mouth daily. 1/2 tablet 11/07/11  Yes Historical Provider, MD  fish oil-omega-3 fatty acids 1000 MG capsule Take 2 g by mouth daily.   Yes Historical Provider, MD  levocetirizine (XYZAL) 5 MG tablet Take 10 mg by  mouth every evening.    Yes Historical Provider, MD  levothyroxine (SYNTHROID, LEVOTHROID) 75 MCG tablet Take 75 mcg by mouth daily.  09/27/11  Yes Historical Provider, MD  montelukast (SINGULAIR) 10 MG tablet Take 10 mg by mouth at bedtime.  09/27/11  Yes Historical Provider, MD  Multiple Vitamin (MULTIVITAMIN) capsule Take 1 capsule by mouth daily.   Yes Historical Provider, MD  NON FORMULARY Take 1 tablet by mouth daily. Curamin   Yes Historical Provider, MD  NON FORMULARY Take 2 tablets by mouth daily. Joint Bulider   Yes Historical Provider, MD  polyethylene glycol powder (GLYCOLAX/MIRALAX) powder Take 17 g by mouth daily. Once daily then twice daily 01/07/12  Yes Joselyn Arrow, NP  ALPRAZolam Prudy Feeler) 0.25 MG tablet Take 0.25 mg by mouth at bedtime as needed.  11/28/11   Historical Provider, MD  PATANASE 0.6 % SOLN Place 1 drop into the nose as needed.  10/03/12   Historical Provider, MD    Allergies as of 12/23/2012 - Review Complete 12/23/2012  Allergen Reaction Noted  . Sulfa antibiotics Rash 12/13/2011    Family History  Problem Relation Age of Onset  . Colon cancer Neg Hx     History   Social History  . Marital Status: Married    Spouse Name: N/A    Number of Children: N/A  . Years of Education: N/A   Occupational History  . Retired  Social History Main Topics  . Smoking status: Former Smoker -- 0.50 packs/day    Types: Cigarettes  . Smokeless tobacco: Not on file     Comment: smoked in her 10s  . Alcohol Use: Yes     Comment: Socially  . Drug Use: No  . Sexual Activity: Not on file   Other Topics Concern  . Not on file   Social History Narrative  . No narrative on file    Review of Systems: See HPI, otherwise negative ROS  Physical Exam: BP 127/83  Pulse 54  Temp(Src) 97.6 F (36.4 C) (Oral)  Wt 130 lb (58.968 kg) General:   Alert,  Well-developed, well-nourished, pleasant and cooperative in NAD Skin:  Intact without significant lesions or  rashes. Eyes:  Sclera clear, no icterus.   Conjunctiva pink. Ears:  Normal auditory acuity. Nose:  No deformity, discharge,  or lesions. Mouth:  No deformity or lesions. Neck:  Supple; no masses or thyromegaly. No significant cervical adenopathy. Lungs:  Clear throughout to auscultation.   No wheezes, crackles, or rhonchi. No acute distress. Heart:  Regular rate and rhythm; 2/6 systolic ejection murmur Abdomen: Non-distended, normal bowel sounds.  No bruits. Soft and nontender without appreciable mass or hepatosplenomegaly.  Pulses:  Normal pulses noted. Extremities:  Without clubbing or edema. Rectal:  Good sphincter tone. No masses rectal vault. Stool in rectal vault. Mucus is Hemoccult negative   Impression:   Very pleasant 62 year old lady with a long history of intermittent abdominal bloating temporally related to constipation he now has intermittent abdominal pain as described above. Pain is fleeting;  it is relatively very short lived. However, frequency seems to be increasing. By patient recall, her constipation has been fairly well managed with MiraLax titrated. She was intolerant to Amitiza.  Symptoms are quite non-specific. She has several days if not a week or 2 in between episodes where she is virtually asymptomatic.  Differential diagnosis regarding as to the etiology of this new abdominal pain remains broad. Symptoms would be atypical for acid peptic disease and underlying pancreatico-biliary etiology as well. Symptoms not all consistent with ischemia as well.  Recommendations:  Retrieve recent labs done to Dr. Jeannette How office. Proceed with cross-sectional imaging in way of a contrast CT of abdomen and pelvis.  Continue to titrate MiraLax to one bowel movement daily or at least every other day. Tentatively plan for screening colonoscopy 2015.

## 2012-12-23 NOTE — Progress Notes (Signed)
Rx for Miralax per Dr. Jena Gauss [facility] [place] to [place], pharmacist. 1054 g   To take 17 g bid and 11 refills.

## 2012-12-23 NOTE — Addendum Note (Signed)
Addended by: Jennings Books on: 12/23/2012 08:53 AM   Modules accepted: Orders

## 2013-01-06 ENCOUNTER — Ambulatory Visit (HOSPITAL_COMMUNITY): Payer: BC Managed Care – PPO

## 2013-01-06 ENCOUNTER — Telehealth: Payer: Self-pay | Admitting: Internal Medicine

## 2013-01-06 NOTE — Telephone Encounter (Signed)
Noted  

## 2013-01-06 NOTE — Telephone Encounter (Signed)
Pt called to let us know that she has cancelled her CT for today because she has had the flu and couldn't drink the contrast. She said that she will [facility] after Christmas.

## 2013-02-11 ENCOUNTER — Ambulatory Visit (HOSPITAL_COMMUNITY)
Admission: RE | Admit: 2013-02-11 | Discharge: 2013-02-11 | Disposition: A | Discharge status: Home / Self Care | Payer: BC Managed Care – PPO | Source: Ambulatory Visit | Attending: Internal Medicine | Admitting: Internal Medicine

## 2013-02-11 DIAGNOSIS — R110 Nausea: Secondary | ICD-10-CM | POA: Insufficient documentation

## 2013-02-11 DIAGNOSIS — R109 Unspecified abdominal pain: Secondary | ICD-10-CM | POA: Insufficient documentation

## 2013-02-11 LAB — POCT I-STAT CREATININE: CREATININE: 0.9 mg/dL (ref 0.50–1.10)

## 2013-02-11 MED ORDER — SODIUM CHLORIDE 0.9 % IJ SOLN
INTRAMUSCULAR | Status: AC
  Filled 2013-02-11: qty 250

## 2013-02-11 MED ORDER — IOHEXOL 300 MG/ML  SOLN
100.0000 mL | Freq: Once | INTRAMUSCULAR | Status: AC | PRN
  Administered 2013-02-11: 100 mL via INTRAVENOUS

## 2013-02-12 ENCOUNTER — Ambulatory Visit (HOSPITAL_COMMUNITY): Payer: BC Managed Care – PPO

## 2013-02-17 ENCOUNTER — Telehealth: Payer: Self-pay

## 2013-02-17 NOTE — Telephone Encounter (Signed)
Gastroenterology Pre-Procedure Review  Request Date: 02/17/2013 Requesting Physician: Dr. Gala Romney  Pt was seen by Dr. Gala Romney for [place] on 12/23/2012 for chronic bloating and constipation Was scheduled for CT/ cancelled and rescheduled due to having flu/ showed constipation  PATIENT REVIEW QUESTIONS: The patient responded to the following health history questions as indicated:    1. Diabetes Melitis: no 2. Joint replacements in the past 12 months: no 3. Major health problems in the past 3 months: no 4. Has an artificial valve or MVP: no 5. Has a defibrillator: no 6. Has been advised in past to take antibiotics in advance of a procedure like teeth cleaning: no    MEDICATIONS & ALLERGIES:    Patient reports the following regarding taking any blood thinners:   Plavix? no Aspirin? no Coumadin? no  Patient confirms/reports the following medications:  Current Outpatient Prescriptions  Medication Sig Dispense Refill  . CENESTIN 0.625 MG tablet Take 0.625 mg by mouth daily. 1/2 tablet      . fish oil-omega-3 fatty acids 1000 MG capsule Take 2 g by mouth daily.      Marland Kitchen levocetirizine (XYZAL) 5 MG tablet Take 10 mg by mouth every evening.       Marland Kitchen levothyroxine (SYNTHROID, LEVOTHROID) 75 MCG tablet Take 75 mcg by mouth daily.       . montelukast (SINGULAIR) 10 MG tablet Take 10 mg by mouth at bedtime.       . Multiple Vitamin (MULTIVITAMIN) capsule Take 1 capsule by mouth daily.      . NON FORMULARY Take 1 tablet by mouth daily. Curamin      . NON FORMULARY Take 2 tablets by mouth daily. Joint Bulider      . polyethylene glycol powder (GLYCOLAX/MIRALAX) powder Take 17 g by mouth daily. Once daily then twice daily      . ALPRAZolam (XANAX) 0.25 MG tablet Take 0.25 mg by mouth at bedtime as needed. May take one tablet once every month or two      . PATANASE 0.6 % SOLN Place 1 drop into the nose as needed.        No current facility-administered medications for this visit.    Patient  confirms/reports the following allergies:  Allergies  Allergen Reactions  . Sulfa Antibiotics Rash    No orders of the defined types were placed in this encounter.    AUTHORIZATION INFORMATION Primary Insurance:   ID #:   Group #:  Pre-Cert / Auth required: Pre-Cert / Auth #:   Secondary Insurance:   ID #:   Group #: Pre-Cert / Auth required:  Pre-Cert / Auth #:   SCHEDULE INFORMATION: Procedure has been scheduled as follows:  Date:  03/10/2013                Time:  10:30 AM Location: [facility] Short Stay  This Gastroenterology Pre-Precedure Review Form is being routed to the following provider(s): R. Garfield Cornea, MD

## 2013-02-17 NOTE — Progress Notes (Signed)
Quick Note:  I called pt to triage for colonoscopy. She said she is having a lot of nausea, almost daily. Most of the time first thing in the AM, but she does not vomit. She does not have anything for nausea. Please advise on this. ______

## 2013-02-17 NOTE — Progress Notes (Signed)
Quick Note:  I called [name] to triage for colonoscopy per Dr. Gala Romney. She said she is having a lot of nausea, almost daily. Most of the time first thing in the AM. She is not having any vomiting. She does not have anything for nausea.Please advise! ( Triage note is separate). ______

## 2013-02-18 ENCOUNTER — Other Ambulatory Visit: Payer: Self-pay

## 2013-02-18 DIAGNOSIS — Z1211 Encounter for screening for malignant neoplasm of colon: Secondary | ICD-10-CM

## 2013-02-18 MED ORDER — PEG-KCL-NACL-NASULF-NA ASC-C 100 G PO SOLR: 1.0000 | ORAL | Status: DC

## 2013-02-18 NOTE — Progress Notes (Signed)
Quick Note:  Called and informed pt. [facility] [place]. ______

## 2013-02-18 NOTE — Telephone Encounter (Signed)
Noted  

## 2013-02-18 NOTE — Telephone Encounter (Signed)
Rx sent to the pharmacy and instructions mailed to pt.  

## 2013-02-24 ENCOUNTER — Encounter (HOSPITAL_COMMUNITY): Payer: Self-pay | Admitting: Pharmacy Technician

## 2013-03-10 ENCOUNTER — Encounter (HOSPITAL_COMMUNITY): Payer: Self-pay | Admitting: *Deleted

## 2013-03-10 ENCOUNTER — Ambulatory Visit (HOSPITAL_COMMUNITY)
Admission: RE | Admit: 2013-03-10 | Discharge: 2013-03-10 | Disposition: A | Discharge status: Home / Self Care | Payer: BC Managed Care – PPO | Source: Ambulatory Visit | Attending: Internal Medicine | Admitting: Internal Medicine

## 2013-03-10 ENCOUNTER — Telehealth: Payer: Self-pay | Admitting: *Deleted

## 2013-03-10 ENCOUNTER — Encounter (HOSPITAL_COMMUNITY)
Admission: RE | Disposition: A | Discharge status: Home / Self Care | Payer: Self-pay | Source: Ambulatory Visit | Attending: Internal Medicine

## 2013-03-10 DIAGNOSIS — Z1211 Encounter for screening for malignant neoplasm of colon: Secondary | ICD-10-CM

## 2013-03-10 DIAGNOSIS — K648 Other hemorrhoids: Secondary | ICD-10-CM | POA: Insufficient documentation

## 2013-03-10 DIAGNOSIS — K5730 Diverticulosis of large intestine without perforation or abscess without bleeding: Secondary | ICD-10-CM | POA: Insufficient documentation

## 2013-03-10 HISTORY — PX: COLONOSCOPY: SHX5424

## 2013-03-10 SURGERY — COLONOSCOPY
Anesthesia: Moderate Sedation

## 2013-03-10 MED ORDER — SODIUM CHLORIDE 0.9 % IV SOLN
INTRAVENOUS | Status: DC
  Administered 2013-03-10: 10:00:00 via INTRAVENOUS

## 2013-03-10 MED ORDER — MIDAZOLAM HCL 5 MG/5ML IJ SOLN
INTRAMUSCULAR | Status: DC | PRN
  Administered 2013-03-10: 1 mg via INTRAVENOUS
  Administered 2013-03-10: 2 mg via INTRAVENOUS
  Administered 2013-03-10: 1 mg via INTRAVENOUS

## 2013-03-10 MED ORDER — MIDAZOLAM HCL 5 MG/5ML IJ SOLN
INTRAMUSCULAR | Status: AC
  Filled 2013-03-10: qty 10

## 2013-03-10 MED ORDER — MEPERIDINE HCL 100 MG/ML IJ SOLN
INTRAMUSCULAR | Status: DC | PRN
  Administered 2013-03-10: 50 mg via INTRAVENOUS
  Administered 2013-03-10: 25 mg via INTRAVENOUS

## 2013-03-10 MED ORDER — MEPERIDINE HCL 100 MG/ML IJ SOLN
INTRAMUSCULAR | Status: AC
  Filled 2013-03-10: qty 2

## 2013-03-10 MED ORDER — ONDANSETRON HCL 4 MG/2ML IJ SOLN
INTRAMUSCULAR | Status: DC
Start: 2013-03-10 — End: 2013-03-10
  Filled 2013-03-10: qty 2

## 2013-03-10 MED ORDER — STERILE WATER FOR IRRIGATION IR SOLN
Status: DC | PRN
  Administered 2013-03-10: 11:00:00

## 2013-03-10 NOTE — Telephone Encounter (Signed)
Agree 

## 2013-03-10 NOTE — OR Nursing (Signed)
Dr. Gala Romney notifed that patients heart rate is between 41 to 51. bp 99/54 with no other symptoms. 554ml of saline bolus ordered

## 2013-03-10 NOTE — Telephone Encounter (Signed)
I called and spoke to pt. She has a headache and usually takes tylenol for it. I told her it would be OK to take her Tylenol with a sip of water and to do it right away. Her procedure is scheduled for 10:30 AM this morning.

## 2013-03-10 NOTE — H&P (Signed)
Primary Care Physician:  Rory Percy, MD Primary Gastroenterologist:  Dr. Gala Romney  Pre-Procedure History & Physical: HPI:  [name] is a 63 y.o. female is here for a screening colonoscopy. Negative colonoscopy 10 years ago. Has chronic bloating discomfort and constipation. Recent CT demonstrated large stool burden but nothing else. MIPs a previously did not work well. MiraLax works only partially. No rectal bleeding.  Past Medical History  Diagnosis Date  . Hypothyroidism   . Chronic constipation   . Ventricular septal defect (VSD), membranous     Documented by echocardiogram 2011  . Secondary pulmonary hypertension     PASP 40 mmHg 2011    Past Surgical History  Procedure Laterality Date  . Abdominal hysterectomy    . Breast lumpectomy    . Tonsillectomy    . Colonoscopy   02/03/2003    RMR: Normal rectum and colon    Prior to Admission medications   Medication Sig Start Date End Date Taking? Authorizing Provider  ALPRAZolam (XANAX) 0.25 MG tablet Take 0.25 mg by mouth at bedtime as needed. May take one tablet once every month or two 11/28/11  Yes Historical Provider, MD  Bioflavonoid Products (ESTER C PO) Take 1,000 mg by mouth daily. Patient only takes from November thru March during flu season   Yes Historical Provider, MD  CENESTIN 0.625 MG tablet Take 0.3125 mg by mouth daily.  11/07/11  Yes Historical Provider, MD  diphenhydrAMINE (BENADRYL) 25 MG tablet Take 25 mg by mouth 2 (two) times daily as needed. Patient alternates with Xyzal   Yes Historical Provider, MD  fish oil-omega-3 fatty acids 1000 MG capsule Take 1 g by mouth daily.    Yes Historical Provider, MD  levocetirizine (XYZAL) 5 MG tablet Take 5 mg by mouth every other day.    Yes Historical Provider, MD  levothyroxine (SYNTHROID, LEVOTHROID) 75 MCG tablet Take 75 mcg by mouth daily.  09/27/11  Yes Historical Provider, MD  montelukast (SINGULAIR) 10 MG tablet Take 10 mg by mouth at bedtime.  09/27/11  Yes  Historical Provider, MD  Multiple Vitamin (MULTIVITAMIN) capsule Take 1 capsule by mouth daily.   Yes Historical Provider, MD  NON FORMULARY Take 1 tablet by mouth daily. Curamin   Yes Historical Provider, MD  NON FORMULARY Take 2 tablets by mouth daily. Joint Bulider   Yes Historical Provider, MD  PATANASE 0.6 % SOLN Place 1 drop into the nose as needed.  10/03/12  Yes Historical Provider, MD  peg 3350 powder (MOVIPREP) 100 G SOLR Take 1 kit (200 g total) by mouth as directed. 02/18/13  Yes Daneil Dolin, MD  polyethylene glycol powder (GLYCOLAX/MIRALAX) powder Take 17 g by mouth 2 (two) times daily as needed. Once daily then twice daily 01/07/12  Yes Andria Meuse, NP    Allergies as of 02/18/2013 - Review Complete 02/17/2013  Allergen Reaction Noted  . Sulfa antibiotics Rash 12/13/2011    Family History  Problem Relation Age of Onset  . Colon cancer Neg Hx     History   Social History  . Marital Status: Legally Separated    Spouse Name: N/A    Number of Children: N/A  . Years of Education: N/A   Occupational History  . Retired    Social History Main Topics  . Smoking status: Former Smoker -- 0.50 packs/day    Types: Cigarettes  . Smokeless tobacco: Not on file     Comment: smoked in her 39s  . Alcohol Use:  Yes     Comment: Socially  . Drug Use: No  . Sexual Activity: Not on file   Other Topics Concern  . Not on file   Social History Narrative  . No narrative on file    Review of Systems: See HPI, otherwise negative ROS  Physical Exam: BP 161/82  Temp(Src) 97.5 F (36.4 C) (Oral)  Resp 16  Ht '5\' 2"'  (1.575 m)  Wt 130 lb (58.968 kg)  BMI 23.77 kg/m2  SpO2 100% General:   Alert,  Well-developed, well-nourished, pleasant and cooperative in NAD Head:  Normocephalic and atraumatic. Eyes:  Sclera clear, no icterus.   Conjunctiva pink. Ears:  Normal auditory acuity. Nose:  No deformity, discharge,  or lesions. Mouth:  No deformity or lesions, dentition  normal. Neck:  Supple; no masses or thyromegaly. Lungs:  Clear throughout to auscultation.   No wheezes, crackles, or rhonchi. No acute distress. Heart:  Regular rate and rhythm; no murmurs, clicks, rubs,  or gallops. Abdomen:  Soft, nontender and nondistended. No masses, hepatosplenomegaly or hernias noted. Normal bowel sounds, without guarding, and without rebound.   Msk:  Symmetrical without gross deformities. Normal posture. Pulses:  Normal pulses noted. Extremities:  Without clubbing or edema. Neurologic:  Alert and  oriented x4;  grossly normal neurologically. Skin:  Intact without significant lesions or rashes. Cervical Nodes:  No significant cervical adenopathy. Psych:  Alert and cooperative. Normal mood and affect.  Impression/Plan: [name] is now here to undergo a screening colonoscopy.  Average risk screening examination.  Risks, benefits, limitations, imponderables and alternatives regarding colonoscopy have been reviewed with the patient. Questions have been answered. All parties agreeable.

## 2013-03-10 NOTE — Telephone Encounter (Signed)
Pt has a awful headache and she said she has a procedure at 9:30 thiis morning, pt wants to know if she can take something for it . Please advise [phone]

## 2013-03-10 NOTE — Op Note (Signed)
[facility] [address] [place], [address]   COLONOSCOPY PROCEDURE REPORT  PATIENT: Kristi, [name]  MR#:         [id number] BIRTHDATE: 1950-11-01 , 13  yrs. old GENDER: Female ENDOSCOPIST: R.  Garfield Cornea, MD FACP FACG REFERRED BY:  Rory Percy, M.D.  Rozann Lesches, M.D. PROCEDURE DATE:  03/10/2013 PROCEDURE:     Screening ileocolonoscopy  INDICATIONS: Average risk colorectal cancer screening examination  INFORMED CONSENT:  The risks, benefits, alternatives and imponderables including but not limited to bleeding, perforation as well as the possibility of a missed lesion have been reviewed.  The potential for biopsy, lesion removal, etc. have also been discussed.  Questions have been answered.  All parties agreeable. Please see the history and physical in the medical record for more information.  MEDICATIONS: Versed 4 mg IV and Demerol 75 mg IV in divided doses. Zofran 4 mg IV.  DESCRIPTION OF PROCEDURE:  After a digital rectal exam was performed, the EC-3890Li ([id number])  colonoscope was advanced from the anus through the rectum and colon to the area of the cecum, ileocecal valve and appendiceal orifice.  The cecum was deeply intubated.  These structures were well-seen and photographed for the record.  From the level of the cecum and ileocecal valve, the scope was slowly and cautiously withdrawn.  The mucosal surfaces were carefully surveyed utilizing scope tip deflection to facilitate fold flattening as needed.  The scope was pulled down into the rectum where a thorough examination including retroflexion was performed.    FINDINGS:  Adequate preparation. Minimal internal hemorrhoids/anal papilla; otherwise normal-appearing rectum. Few scattered sigmoid diverticula; the remainder of the colonic mucosa appeared normal. The distal 5 cm of terminal ileal mucosa also appeared normal  THERAPEUTIC / DIAGNOSTIC MANEUVERS PERFORMED:   none  COMPLICATIONS: None  CECAL WITHDRAWAL TIME:  17 minutes  IMPRESSION:  Colonic diverticulosis  RECOMMENDATIONS: Begin Benefiber 2 teaspoons twice daily. Go to my office for samples of Linzess - one-  145 capsule daily x2 weeks; then call with a progress report. May need to go up to 290 dose in the near future. Colonoscopy in 10 years for screening purposes.   _______________________________ eSigned:  R. Garfield Cornea, MD FACP [facility] 03/10/2013 11:40 AM   CC:    PATIENT NAME:  Kristi, [name] MR#: [id number]

## 2013-03-10 NOTE — Discharge Instructions (Signed)
Colonoscopy Discharge Instructions  Read the instructions outlined below and refer to this sheet in the next few weeks. These discharge instructions provide you with general information on caring for yourself after you leave the hospital. Your doctor may also give you specific instructions. While your treatment has been planned according to the most current medical practices available, unavoidable complications occasionally occur. If you have any problems or questions after discharge, call Dr. Gala Romney at [phone]. ACTIVITY  You may resume your regular activity, but move at a slower pace for the next 24 hours.   Take frequent rest periods for the next 24 hours.   Walking will help get rid of the air and reduce the bloated feeling in your belly (abdomen).   No driving for 24 hours (because of the medicine (anesthesia) used during the test).    Do not sign any important legal documents or operate any machinery for 24 hours (because of the anesthesia used during the test).  NUTRITION  Drink plenty of fluids.   You may resume your normal diet as instructed by your doctor.   Begin with a light meal and progress to your normal diet. Heavy or fried foods are harder to digest and may make you feel sick to your stomach (nauseated).   Avoid alcoholic beverages for 24 hours or as instructed.  MEDICATIONS  You may resume your normal medications unless your doctor tells you otherwise.  WHAT YOU CAN EXPECT TODAY  Some feelings of bloating in the abdomen.   Passage of more gas than usual.   Spotting of blood in your stool or on the toilet paper.  IF YOU HAD POLYPS REMOVED DURING THE COLONOSCOPY:  No aspirin products for 7 days or as instructed.   No alcohol for 7 days or as instructed.   Eat a soft diet for the next 24 hours.  FINDING OUT THE RESULTS OF YOUR TEST Not all test results are available during your visit. If your test results are not back during the visit, make an appointment  with your caregiver to find out the results. Do not assume everything is normal if you have not heard from your caregiver or the medical facility. It is important for you to follow up on all of your test results.  SEEK IMMEDIATE MEDICAL ATTENTION IF:  You have more than a spotting of blood in your stool.   Your belly is swollen (abdominal distention).   You are nauseated or vomiting.   You have a temperature over 101.   You have abdominal pain or discomfort that is severe or gets worse throughout the day.    Diverticulosis information provided  Add Benefiber 2 teaspoons twice daily to your regimen  Begin Linzess 145 one capsule daily for 2 weeks. Call the office and let us no know how you are doing in 2 weeks. Depending on your response, may increase to 2 capsules daily  Plan for screening colonoscopy in 10 years  Diverticulosis Diverticulosis is a common condition that develops when small pouches (diverticula) form in the wall of the colon. The risk of diverticulosis increases with age. It happens more often in people who eat a low-fiber diet. Most individuals with diverticulosis have no symptoms. Those individuals with symptoms usually experience abdominal pain, constipation, or loose stools (diarrhea). HOME CARE INSTRUCTIONS   Increase the amount of fiber in your diet as directed by your caregiver or dietician. This may reduce symptoms of diverticulosis.  Your caregiver may recommend taking a dietary fiber supplement.  Drink at least 6 to 8 glasses of water each day to prevent constipation.  Try not to strain when you have a bowel movement.  Your caregiver may recommend avoiding nuts and seeds to prevent complications, although this is still an uncertain benefit.  Only take over-the-counter or prescription medicines for pain, discomfort, or fever as directed by your caregiver. FOODS WITH HIGH FIBER CONTENT INCLUDE:  Fruits. Apple, peach, pear, tangerine, raisins,  prunes.  Vegetables. Brussels sprouts, asparagus, broccoli, cabbage, carrot, cauliflower, romaine lettuce, spinach, summer squash, tomato, winter squash, zucchini.  Starchy Vegetables. Baked beans, kidney beans, lima beans, split peas, lentils, potatoes (with skin).  Grains. Whole wheat bread, brown rice, bran flake cereal, plain oatmeal, white rice, shredded wheat, bran muffins. SEEK IMMEDIATE MEDICAL CARE IF:   You develop increasing pain or severe bloating.  You have an oral temperature above 102 F (38.9 C), not controlled by medicine.  You develop vomiting or bowel movements that are bloody or black.

## 2013-03-12 ENCOUNTER — Other Ambulatory Visit: Payer: Self-pay

## 2013-03-12 ENCOUNTER — Encounter (HOSPITAL_COMMUNITY): Payer: Self-pay | Admitting: Internal Medicine

## 2013-03-12 MED ORDER — LINACLOTIDE 145 MCG PO CAPS: 145.0000 ug | ORAL_CAPSULE | Freq: Every day | ORAL | Status: DC

## 2013-03-23 ENCOUNTER — Telehealth: Payer: Self-pay

## 2013-03-23 NOTE — Telephone Encounter (Signed)
Pt called- Linzess 168mcg is not working well at all. Pt has BM every other day and it is only a small amount. Pt stated it doesn't work as well as the miralax. Discussed with her about going up to the 212mcg and she stated in 2 months she will have medicare and Linzess is not on her formulary, and will cost her $300.00 a month. She wants to know if she can go back on miralax or if she can take otc colace daily.

## 2013-03-23 NOTE — Telephone Encounter (Signed)
Progress with Linzess noted. Higher dose would likely do the job but I understand cost issue.  May get back to [facility] and would be a little more aggressive. I would take 2 doses each morning to facilitate bowel function and I would not hesitate to take 1-2 doses in the evening on an as-needed basis to produce a reasonably good bowel movement daily to every other day. Would be also OK to take Colace daily office visit with me in 6-8 weeks.

## 2013-03-23 NOTE — Telephone Encounter (Signed)
Pt is aware. [name], please schedule ov in 6-8 weeks with RMR

## 2013-03-24 NOTE — Telephone Encounter (Signed)
I had to put a reminder in epic to make OV with RMR in 6-8 weeks. RMR schedule isn't available yet.

## 2013-04-23 ENCOUNTER — Telehealth: Payer: Self-pay | Admitting: Internal Medicine

## 2013-04-23 NOTE — Telephone Encounter (Signed)
Patient called to speak with nurse about her Linzess and how it was working for her and wanted to speak with the nurse. Please call her back at this number today [phone]

## 2013-04-23 NOTE — Telephone Encounter (Signed)
Noted  

## 2013-04-23 NOTE — Telephone Encounter (Signed)
Spoke with pt- she did finish out the month with the Linzess. She said it finally started to work but with the cost of the medication she is unable to afford it. She is going to continue with the miralax for now. She is doing good. She just wanted RMR to know.

## 2013-04-30 DIAGNOSIS — M999 Biomechanical lesion, unspecified: Secondary | ICD-10-CM | POA: Diagnosis not present

## 2013-04-30 DIAGNOSIS — M5430 Sciatica, unspecified side: Secondary | ICD-10-CM | POA: Diagnosis not present

## 2013-05-05 DIAGNOSIS — J019 Acute sinusitis, unspecified: Secondary | ICD-10-CM | POA: Diagnosis not present

## 2013-05-13 DIAGNOSIS — M5430 Sciatica, unspecified side: Secondary | ICD-10-CM | POA: Diagnosis not present

## 2013-05-13 DIAGNOSIS — M999 Biomechanical lesion, unspecified: Secondary | ICD-10-CM | POA: Diagnosis not present

## 2013-05-26 DIAGNOSIS — M5430 Sciatica, unspecified side: Secondary | ICD-10-CM | POA: Diagnosis not present

## 2013-05-26 DIAGNOSIS — M999 Biomechanical lesion, unspecified: Secondary | ICD-10-CM | POA: Diagnosis not present

## 2013-06-01 ENCOUNTER — Encounter: Payer: Self-pay | Admitting: Podiatry

## 2013-06-01 ENCOUNTER — Ambulatory Visit (INDEPENDENT_AMBULATORY_CARE_PROVIDER_SITE_OTHER): Payer: Medicare Other

## 2013-06-01 ENCOUNTER — Ambulatory Visit (INDEPENDENT_AMBULATORY_CARE_PROVIDER_SITE_OTHER): Payer: Medicare Other | Admitting: Podiatry

## 2013-06-01 VITALS — BP 127/72 | HR 64 | Resp 12

## 2013-06-01 DIAGNOSIS — R52 Pain, unspecified: Secondary | ICD-10-CM

## 2013-06-01 DIAGNOSIS — G5760 Lesion of plantar nerve, unspecified lower limb: Secondary | ICD-10-CM | POA: Diagnosis not present

## 2013-06-01 DIAGNOSIS — M779 Enthesopathy, unspecified: Secondary | ICD-10-CM

## 2013-06-01 MED ORDER — TRIAMCINOLONE ACETONIDE 10 MG/ML IJ SUSP
10.0000 mg | Freq: Once | INTRAMUSCULAR | Status: AC
  Administered 2013-06-01: 10 mg

## 2013-06-01 NOTE — Progress Notes (Signed)
Subjective:     Patient ID: [name], female   DOB: 24-Aug-1950, 63 y.o.   MRN: [id number]  HPI patient states I'm getting a lot of pain in the ball of my foot that has worsened over the last month. I went to a chiropractor who did ultrasound and laser but that has not helped and I was referred by my family physician   Review of Systems  All other systems reviewed and are negative.      Objective:   Physical Exam  Nursing note and vitals reviewed. Constitutional: She is oriented to person, place, and time.  Cardiovascular: Intact distal pulses.   Musculoskeletal: Normal range of motion.  Neurological: She is oriented to person, place, and time.  Skin: Skin is warm.   neurovascular status intact with muscle strength adequate and range of motion of the subtalar and midtarsal joint within normal limits. Patient is found to have discomfort and shooting pains third interspace right foot and mild to moderate discomfort around the metatarsophalangeal joint of the right foot. 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 Structural HAV deformity right    Plan:     H&P and x-rays reviewed and today I did a steroid injection to the third interspace right to both D. activate the nerve for the short-term and to see whether or not this will reduce inflammation around the nerve root with possibility for neural lysis treatment in the future. Reappoint her recheck in the next 2 weeks

## 2013-06-01 NOTE — Progress Notes (Signed)
   Subjective:    Patient ID: [name], female    DOB: 12-Jun-1950, 63 y.o.   MRN: [id number]  HPI pt stated ball of the foot is sore and sore for about 1 month. The foot is been the same, not getting worse. The foot get aggravated by walking bare foot. Dr. Bethann Goo tried laser, icing and naproxen but is not getting better.    Review of Systems  HENT: Positive for sinus pressure.   Genitourinary: Positive for frequency.  Musculoskeletal: Positive for joint swelling.  Skin: Positive for color change.  Neurological: Positive for dizziness and light-headedness.       Objective:   Physical Exam        Assessment & Plan:

## 2013-06-02 DIAGNOSIS — M999 Biomechanical lesion, unspecified: Secondary | ICD-10-CM | POA: Diagnosis not present

## 2013-06-02 DIAGNOSIS — M5430 Sciatica, unspecified side: Secondary | ICD-10-CM | POA: Diagnosis not present

## 2013-06-12 DIAGNOSIS — M999 Biomechanical lesion, unspecified: Secondary | ICD-10-CM | POA: Diagnosis not present

## 2013-06-12 DIAGNOSIS — M5430 Sciatica, unspecified side: Secondary | ICD-10-CM | POA: Diagnosis not present

## 2013-06-15 ENCOUNTER — Ambulatory Visit (INDEPENDENT_AMBULATORY_CARE_PROVIDER_SITE_OTHER): Payer: Medicare Other | Admitting: Podiatry

## 2013-06-15 ENCOUNTER — Encounter: Payer: Self-pay | Admitting: Podiatry

## 2013-06-15 VITALS — BP 134/81 | HR 62 | Resp 16

## 2013-06-15 DIAGNOSIS — G5760 Lesion of plantar nerve, unspecified lower limb: Secondary | ICD-10-CM

## 2013-06-15 NOTE — Progress Notes (Signed)
Subjective:     Patient ID: [name], female   DOB: 08-May-1950, 63 y.o.   MRN: [id number]  HPI patient states the shooting pain is still present I have 8 hours of relief followed by recurrence of pain   Review of Systems     Objective:   Physical Exam Neurovascular status intact with continued discomfort third interspace right with radiating discomfort into the adjacent digits    Assessment:     Neuroma symptomatology right foot    Plan:     H&P reviewed and recommended neuro lysis treatment. Did a sterile prep in the area and injected directly into the nerve prior to breaking into digital branches with D. hydrated alcohol and Marcaine

## 2013-06-16 ENCOUNTER — Ambulatory Visit (INDEPENDENT_AMBULATORY_CARE_PROVIDER_SITE_OTHER): Payer: Medicare Other | Admitting: Internal Medicine

## 2013-06-16 ENCOUNTER — Encounter: Payer: Self-pay | Admitting: Internal Medicine

## 2013-06-16 VITALS — BP 104/71 | HR 54 | Temp 97.6°F | Ht 62.0 in | Wt 125.0 lb

## 2013-06-16 DIAGNOSIS — K5900 Constipation, unspecified: Secondary | ICD-10-CM

## 2013-06-16 NOTE — Progress Notes (Signed)
I called in refill on the Miralax x 3 months to [facility] in [place] per Dr. Gala Romney.

## 2013-06-16 NOTE — Patient Instructions (Addendum)
Take one or 2 doses of Miralax at bedtime; avoid taking in the morning if possible (try taking 2 doses nightly as discussed)  Call me in a couple of weeks and let me know how you are doing

## 2013-06-23 NOTE — Progress Notes (Signed)
Primary Care Physician:  Rory Percy, MD Primary Gastroenterologist:  Dr. Gala Romney  Pre-Procedure History & Physical: HPI:  [name] is a 63 y.o. female here for followup of constipation constipation associated abdominal pain. Linzess too expensive but was efficacious.  Takes MiraLax one to 2 doses in the morning does not feel she can leave the house for fear of having incomplete bowel movements multiple times during the day. Has not really been taking MiraLax at bedtime. Hasn't had any rectal bleeding.  When bowels moving better she has less.  Past Medical History  Diagnosis Date  . Hypothyroidism   . Chronic constipation   . Ventricular septal defect (VSD), membranous     Documented by echocardiogram 2011  . Secondary pulmonary hypertension     PASP 40 mmHg 2011  . Diverticulosis     Past Surgical History  Procedure Laterality Date  . Abdominal hysterectomy    . Breast lumpectomy    . Tonsillectomy    . Colonoscopy   02/03/2003    RMR: Normal rectum and colon  . Colonoscopy N/A 03/10/2013    Dr. Gala Romney-  minimal internal hemorrhoids/ anal papilla o/w normal appearing rectum. colonic diverticulosis    Prior to Admission medications   Medication Sig Start Date End Date Taking? Authorizing Provider  ALPRAZolam (XANAX) 0.25 MG tablet Take 0.25 mg by mouth at bedtime as needed. May take one tablet once every month or two 11/28/11  Yes Historical Provider, MD  desloratadine (CLARINEX) 5 MG tablet Take by mouth daily.   Yes Historical Provider, MD  diphenhydrAMINE (BENADRYL) 25 MG tablet Take 25 mg by mouth 2 (two) times daily as needed. Patient alternates with Xyzal   Yes Historical Provider, MD  fish oil-omega-3 fatty acids 1000 MG capsule Take 1 g by mouth daily.    Yes Historical Provider, MD  levothyroxine (SYNTHROID, LEVOTHROID) 75 MCG tablet Take 75 mcg by mouth daily.  09/27/11  Yes Historical Provider, MD  montelukast (SINGULAIR) 10 MG tablet Take 10 mg by mouth at  bedtime.  09/27/11  Yes Historical Provider, MD  Multiple Vitamin (MULTIVITAMIN) capsule Take 1 capsule by mouth daily.   Yes Historical Provider, MD  NON FORMULARY Take 1 tablet by mouth daily. Curamin   Yes Historical Provider, MD  PATANASE 0.6 % SOLN Place 1 drop into the nose as needed.  10/03/12  Yes Historical Provider, MD  polyethylene glycol powder (GLYCOLAX/MIRALAX) powder Take 17 g by mouth 2 (two) times daily as needed. Once daily then twice daily 01/07/12  Yes Andria Meuse, NP  Jalene Mullet Pollen Allergen (GRASTEK) 2800 BAU SUBL Place 2,800 BAU/day under the tongue 1 day or 1 dose.   Yes Historical Provider, MD  NON FORMULARY Take 2 tablets by mouth daily. Joint Bulider    Historical Provider, MD  peg 3350 powder (MOVIPREP) 100 G SOLR Take 1 kit (200 g total) by mouth as directed. 02/18/13   Daneil Dolin, MD    Allergies as of 06/16/2013 - Review Complete 06/16/2013  Allergen Reaction Noted  . Sulfa antibiotics Rash 12/13/2011    Family History  Problem Relation Age of Onset  . Colon cancer Neg Hx     History   Social History  . Marital Status: Legally Separated    Spouse Name: N/A    Number of Children: N/A  . Years of Education: N/A   Occupational History  . Retired    Social History Main Topics  . Smoking status: Former  Smoker -- 0.50 packs/day    Types: Cigarettes  . Smokeless tobacco: Not on file     Comment: smoked in her 4s  . Alcohol Use: Yes     Comment: Socially  . Drug Use: No  . Sexual Activity: Not on file   Other Topics Concern  . Not on file   Social History Narrative  . No narrative on file    Review of Systems: See HPI, otherwise negative ROS  Physical Exam: BP 104/71  Pulse 54  Temp(Src) 97.6 F (36.4 C) (Oral)  Ht '5\' 2"'  (1.575 m)  Wt 125 lb (56.7 kg)  BMI 22.86 kg/m2 General:   Alert,  Well-developed, well-nourished, pleasant and cooperative in NAD Skin:  Intact without significant lesions or rashes. Eyes:  Sclera clear,  no icterus.   Conjunctiva pink. Ears:  Normal auditory acuity. Nose:  No deformity, discharge,  or lesions. Mouth:  No deformity or lesions. Neck:  Supple; no masses or thyromegaly. No significant cervical adenopathy. Lungs:  Clear throughout to auscultation.   No wheezes, crackles, or rhonchi. No acute distress. Heart:  Regular rate and rhythm; no murmurs, clicks, rubs,  or gallops. Abdomen: Non-distended, normal bowel sounds.  Soft and nontender without appreciable mass or hepatosplenomegaly.  Pulses:  Normal pulses noted. Extremities:  Without clubbing or edema.  Impression:  Chronic abdominal pain associated with constipation. This is been a chronic, long-standing, nonprogressive problem for this nice lady. I believe we just need to fine-tune the timing and dosing of polyethylene glycol to get the optimal affect.  Recommendations: Take one or 2 doses of Miralax at bedtime; avoid taking in the morning if possible (try taking 2 doses nightly as discussed) Keep a stool diary. Call me in a couple of weeks and let me know how you are doing      Notice: This dictation was prepared with Dragon dictation along with smaller phrase technology. Any transcriptional errors that result from this process are unintentional and may not be corrected upon review.

## 2013-07-01 DIAGNOSIS — M999 Biomechanical lesion, unspecified: Secondary | ICD-10-CM | POA: Diagnosis not present

## 2013-07-01 DIAGNOSIS — M5430 Sciatica, unspecified side: Secondary | ICD-10-CM | POA: Diagnosis not present

## 2013-07-06 ENCOUNTER — Ambulatory Visit (INDEPENDENT_AMBULATORY_CARE_PROVIDER_SITE_OTHER): Payer: Medicare Other | Admitting: Podiatry

## 2013-07-06 VITALS — BP 127/86 | HR 64 | Resp 12

## 2013-07-06 DIAGNOSIS — G5760 Lesion of plantar nerve, unspecified lower limb: Secondary | ICD-10-CM | POA: Diagnosis not present

## 2013-07-07 NOTE — Progress Notes (Signed)
Subjective:     Patient ID: [name], female   DOB: [birth year], 63 y.o.   MRN: [id number]  HPI patient presents stating my foot is slowly getting better with reduced burning   Review of Systems     Objective:   Physical Exam Neurovascular status intact with continued discomfort third interspace right reduced from previous visit    Assessment:     Improve symptoms from neuroma-like symptoms right    Plan:     Reinjected the third interspace with a dehydration and alcohol solution 4% and Marcaine

## 2013-07-16 ENCOUNTER — Telehealth: Payer: Self-pay | Admitting: *Deleted

## 2013-07-16 NOTE — Telephone Encounter (Signed)
Tried to call pt- LMOM 

## 2013-07-16 NOTE — Telephone Encounter (Signed)
Pt called stating Dr. Gala Romney has put her on polyethylene after her TCS and pt said she is doing well on it sometimes it does seem a little much and pt will take a single dose instead of a double pt is supposed to take a double dose. Pt is concerned does she need to stay on it taking double dose or will she be fine to take a single, pt said she was trying to adjust to the medication. Please advise [phone]

## 2013-07-17 NOTE — Telephone Encounter (Signed)
Spoke with pt- advised her to titrate her dosage of miralax per her needs, and if she gets diarrhea she will need to hold that dose. Pt stated she has been doing that, she just wanted to make sure it was ok.

## 2013-07-18 NOTE — Telephone Encounter (Signed)
Her approach is OK; agree with advice given.

## 2013-07-27 ENCOUNTER — Ambulatory Visit (INDEPENDENT_AMBULATORY_CARE_PROVIDER_SITE_OTHER): Payer: Medicare Other | Admitting: Podiatry

## 2013-07-27 VITALS — BP 112/78 | HR 75 | Resp 16

## 2013-07-27 DIAGNOSIS — G5761 Lesion of plantar nerve, right lower limb: Secondary | ICD-10-CM

## 2013-07-27 DIAGNOSIS — G5760 Lesion of plantar nerve, unspecified lower limb: Secondary | ICD-10-CM

## 2013-07-27 NOTE — Progress Notes (Signed)
Subjective:     Patient ID: [name], female   DOB: [birth year], 63 y.o.   MRN: [id number]  HPI patient states I'm doing pretty well my right foot but the nerve is coming back and shooting between my third and fourth toes I also have mild discomfort around the second metatarsal joint   Review of Systems     Objective:   Physical Exam Neurovascular status intact with continued positive Mulder sign right but improved and discomfort on the second MPJ right that is painful when pressed    Assessment:     Continue neuroma symptoms with possible capsulitis of the right second MPJ    Plan:     X-rays reviewed with patient and I did do a neuro lysis injection today purified dehydrated alcohol in combination with Marcaine 1.5 cc and applied thick plantar pad to reduce pressure against the second metatarsal with possibility for treatment if it continues to be symptomatic

## 2013-07-27 NOTE — Progress Notes (Signed)
   Subjective:    Patient ID: [name], female    DOB: [birth year], 63 y.o.   MRN: [id number]  HPI  Pt presents with pain in her right foot/toe 2nd met, states that the pain has improved but today it is the worst it has ever been.Pain is intermittent when walking.    Review of Systems     Objective:   Physical Exam        Assessment & Plan:

## 2013-08-05 DIAGNOSIS — M999 Biomechanical lesion, unspecified: Secondary | ICD-10-CM | POA: Diagnosis not present

## 2013-08-05 DIAGNOSIS — M5430 Sciatica, unspecified side: Secondary | ICD-10-CM | POA: Diagnosis not present

## 2013-08-24 ENCOUNTER — Ambulatory Visit (INDEPENDENT_AMBULATORY_CARE_PROVIDER_SITE_OTHER): Payer: Medicare Other | Admitting: Podiatry

## 2013-08-24 ENCOUNTER — Encounter: Payer: Self-pay | Admitting: Podiatry

## 2013-08-24 VITALS — BP 122/72 | HR 61 | Resp 18

## 2013-08-24 DIAGNOSIS — G5761 Lesion of plantar nerve, right lower limb: Secondary | ICD-10-CM

## 2013-08-24 DIAGNOSIS — G5760 Lesion of plantar nerve, unspecified lower limb: Secondary | ICD-10-CM

## 2013-08-24 NOTE — Progress Notes (Signed)
Subjective:     Patient ID: [name], female   DOB: 1950-06-17, 63 y.o.   MRN: [id number]  HPI patient states that her right foot seems to be doing okay but she still getting shooting between the toes third and fourth and second and third the most the second and third with discomfort in the forefoot   Review of Systems     Objective:   Physical Exam Neurovascular status intact with continued discomfort second interspace right foot with shooting radiating discomfort and mild discomfort third interspace    Assessment:     Neuroma symptoms with possibility for inflammatory capsulitis    Plan:     Continue to work on the neuromas with a neuro lysis injection of 1.2 cc of dehydrated alcohol and Marcaine purified. Reappoint 4 weeks

## 2013-08-25 DIAGNOSIS — R059 Cough, unspecified: Secondary | ICD-10-CM | POA: Diagnosis not present

## 2013-08-25 DIAGNOSIS — R05 Cough: Secondary | ICD-10-CM | POA: Diagnosis not present

## 2013-08-25 DIAGNOSIS — J309 Allergic rhinitis, unspecified: Secondary | ICD-10-CM | POA: Diagnosis not present

## 2013-08-28 DIAGNOSIS — M999 Biomechanical lesion, unspecified: Secondary | ICD-10-CM | POA: Diagnosis not present

## 2013-08-28 DIAGNOSIS — M5430 Sciatica, unspecified side: Secondary | ICD-10-CM | POA: Diagnosis not present

## 2013-09-11 DIAGNOSIS — M5430 Sciatica, unspecified side: Secondary | ICD-10-CM | POA: Diagnosis not present

## 2013-09-11 DIAGNOSIS — M999 Biomechanical lesion, unspecified: Secondary | ICD-10-CM | POA: Diagnosis not present

## 2013-09-17 ENCOUNTER — Encounter: Payer: Self-pay | Admitting: Podiatry

## 2013-09-17 ENCOUNTER — Ambulatory Visit (INDEPENDENT_AMBULATORY_CARE_PROVIDER_SITE_OTHER): Payer: Medicare Other | Admitting: Podiatry

## 2013-09-17 VITALS — BP 112/81 | HR 76 | Resp 16

## 2013-09-17 DIAGNOSIS — G5760 Lesion of plantar nerve, unspecified lower limb: Secondary | ICD-10-CM

## 2013-09-17 DIAGNOSIS — G5761 Lesion of plantar nerve, right lower limb: Secondary | ICD-10-CM

## 2013-09-20 NOTE — Progress Notes (Signed)
Subjective:     Patient ID: [name], female   DOB: Oct 10, 1950, 63 y.o.   MRN: [id number]  HPI patient presents stating that the pain continues to reduce but is still quite sore when pressed and makes it hard for me to walk comfortably   Review of Systems     Objective:   Physical Exam Neurovascular status intact with continued discomfort between the third and fourth toes on the right foot with radiating-type pains noted    Assessment:     Neuroma symptomatology still present right    Plan:     Reviewed condition and recommended continued neuro lysis treatment and did a sterile prep and injected directly into the nerve root prior to breaking his digital branches with 4% alcohol solution and Marcaine

## 2013-09-21 ENCOUNTER — Ambulatory Visit: Payer: Medicare Other | Admitting: Podiatry

## 2013-09-29 DIAGNOSIS — M999 Biomechanical lesion, unspecified: Secondary | ICD-10-CM | POA: Diagnosis not present

## 2013-09-29 DIAGNOSIS — M5430 Sciatica, unspecified side: Secondary | ICD-10-CM | POA: Diagnosis not present

## 2013-10-01 ENCOUNTER — Ambulatory Visit (INDEPENDENT_AMBULATORY_CARE_PROVIDER_SITE_OTHER): Payer: Medicare Other | Admitting: Podiatry

## 2013-10-01 ENCOUNTER — Encounter: Payer: Self-pay | Admitting: Podiatry

## 2013-10-01 VITALS — BP 122/80 | HR 71 | Resp 16

## 2013-10-01 DIAGNOSIS — G5760 Lesion of plantar nerve, unspecified lower limb: Secondary | ICD-10-CM | POA: Diagnosis not present

## 2013-10-01 DIAGNOSIS — G5761 Lesion of plantar nerve, right lower limb: Secondary | ICD-10-CM

## 2013-10-01 NOTE — Progress Notes (Signed)
Subjective:     Patient ID: [name], female   DOB: August 29, 1950, 62 y.o.   MRN: [id number]  HPI patient presents stating that I'm still getting a lot of radiating pains between the third and fourth toes on my right foot. It is slightly improved but still is quite sore  Review of Systems     Objective:   Physical Exam Neurovascular status is intact but continues to experience exquisite discomfort between the third and fourth toes right foot with shooting pains occurring between the toes    Assessment:     Continue neuroma symptomatology despite neuro lysis treatment right    Plan:     Long-term I've recommended neurectomy for the right third interspace and she would like to get this done but needs to wait for November. I allowed her to read a consent form for correction and I reviewed what would be required for surgery and reviewed the consent form as listed and all possible complications that are listed. Patient wants surgery and is scheduled in November and today I did do a neuro lysis injection to try to help her short term

## 2013-10-01 NOTE — Patient Instructions (Signed)
Morton's Neuroma in Sports  (Interdigital Plantar Neuroma) Morton's neuroma is a condition of the nervous system that results in pain or loss of feeling in the toes. The disease is caused by the bones of the foot squeezing the nerve that runs between two toes (interdigital nerve). The third and fourth toes are most likely to be affected by this disease. SYMPTOMS   Tingling, numbness, burning, or electric shocks in the front of the foot, often involving the third and fourth toes, although it may involve any other pair of toes.  Pain and tenderness in the front of the foot, that gets worse when walking.  Pain that gets worse when pressure is applied to the foot (wearing shoes).  Severe pain in the front of the foot, when standing on the front of the foot (on tiptoes), such as with running, jumping, pivoting, or dancing. CAUSES  Morton's neuroma is caused by swelling of the nerve between two toes. This swelling causes the nerve to be pinched between the bones of the foot. RISK INCREASES WITH:  Recurring foot or ankle injuries.  Poor fitting or worn shoes, with minimal padding and shock absorbers.  Loose ligaments of the foot, causing thickening of the nerve.  Poor foot strength and flexibility. PREVENTION  Warm up and stretch properly before activity.  Maintain physical fitness:  Foot and ankle flexibility.  Muscle strength and endurance.  Cardiovascular fitness.  Wear properly fitted and padded shoes.  Wear arch supports (orthotics), when needed. PROGNOSIS  If treated properly, Morton's neuroma can usually be cured with non-surgical treatment. For certain cases, surgery may be needed. RELATED COMPLICATIONS  Permanent numbness and pain in the foot.  Inability to participate in athletics, because of pain. TREATMENT Treatment first involves stopping any activities that make the symptoms worse. The use of ice and medicine will help reduce pain and inflammation. Wearing shoes  with a wide toe box, and an orthotic arch support or metatarsal bar, may also reduce pain. Your caregiver may give you a corticosteroid injection, to further reduce inflammation. If non-surgical treatment is unsuccessful, surgery may be needed. Surgery to fix Morton's neuroma is often performed as an outpatient procedure, meaning you can go home the same day as the surgery. The procedure involves removing the source of pressure on the nerve. If it is necessary to remove the nerve, you can expect persistent numbness. MEDICATION  If pain medicine is needed, nonsteroidal anti-inflammatory medicines (aspirin and ibuprofen), or other minor pain relievers (acetaminophen), are often advised.  Do not take pain medicine for 7 days before surgery.  Prescription pain relievers are usually prescribed only after surgery. Use only as directed and only as much as you need.  Corticosteroid injections are used in extreme cases, to reduce inflammation. These injections should be done only if necessary, because they may be given only a limited number of times. HEAT AND COLD  Cold treatment (icing) should be applied for 10 to 15 minutes every 2 to 3 hours for inflammation and pain, and immediately after activity that aggravates your symptoms. Use ice packs or an ice massage.  Heat treatment may be used before performing stretching and strengthening activities prescribed by your caregiver, physical therapist, or athletic trainer. Use a heat pack or a warm water soak. SEEK MEDICAL CARE IF:   Symptoms get worse or do not improve in 2 weeks, despite treatment.  After surgery you develop increasing pain, swelling, redness, increased warmth, bleeding, drainage of fluids, or fever.  New, unexplained symptoms develop. (  Drugs used in treatment may produce side effects.) Document Released: 11/22/2004 Document Revised: 04/09/2011 Document Reviewed: 04/29/2008 [facility] Patient Information 2015 [place], [place]. This  information is not intended to replace advice given to you by your health care provider. Make sure you discuss any questions you have with your health care provider.    Pre-Operative Instructions  Congratulations, you have decided to take an important step to improving your quality of life.  You can be assured that the doctors of [facility] will be with you every step of the way.  1. Plan to be at the surgery center/hospital at least 1 (one) hour prior to your scheduled time unless otherwise directed by the surgical center/hospital staff.  You must have a responsible adult accompany you, remain during the surgery and drive you home.  Make sure you have directions to the surgical center/hospital and know how to get there on time. 2. For hospital based surgery you will need to obtain a history and physical form from your family physician within 1 month prior to the date of surgery- we will give you a form for you primary physician.  3. We make every effort to accommodate the date you request for surgery.  There are however, times where surgery dates or times have to be moved.  We will contact you as soon as possible if a change in schedule is required.   4. No Aspirin/Ibuprofen for one week before surgery.  If you are on aspirin, any non-steroidal anti-inflammatory medications (Mobic, Aleve, Ibuprofen) you should stop taking it 7 days prior to your surgery.  You make take Tylenol  For pain prior to surgery.  5. Medications- If you are taking daily heart and blood pressure medications, seizure, reflux, allergy, asthma, anxiety, pain or diabetes medications, make sure the surgery center/hospital is aware before the day of surgery so they may notify you which medications to take or avoid the day of surgery. 6. No food or drink after midnight the night before surgery unless directed otherwise by surgical center/hospital staff. 7. No alcoholic beverages 24 hours prior to surgery.  No smoking 24 hours  prior to or 24 hours after surgery. 8. Wear loose pants or shorts- loose enough to fit over bandages, boots, and casts. 9. No slip on shoes, sneakers are best. 10. Bring your boot with you to the surgery center/hospital.  Also bring crutches or a walker if your physician has prescribed it for you.  If you do not have this equipment, it will be provided for you after surgery. 11. If you have not been contracted by the surgery center/hospital by the day before your surgery, call to confirm the date and time of your surgery. 12. Leave-time from work may vary depending on the type of surgery you have.  Appropriate arrangements should be made prior to surgery with your employer. 13. Prescriptions will be provided immediately following surgery by your doctor.  Have these filled as soon as possible after surgery and take the medication as directed. 14. Remove nail polish on the operative foot. 15. Wash the night before surgery.  The night before surgery wash the foot and leg well with the antibacterial soap provided and water paying special attention to beneath the toenails and in between the toes.  Rinse thoroughly with water and dry well with a towel.  Perform this wash unless told not to do so by your physician.  Enclosed: 1 Ice pack (please put in freezer the night before surgery)  1 Hibiclens skin cleaner   Pre-op Instructions  If you have any questions regarding the instructions, do not hesitate to call our office.  [place]: [place], [place] [address] [place]: [address], [place], [place] [address] [phone]  [place]: [address], [place] [address] [phone]  Dr. Kendell Bane DPM, Dr. Ila Mcgill DPM Dr. Harriet Masson DPM, Dr. Lanelle Bal DPM, Dr. Trudie Buckler DPM

## 2013-10-05 DIAGNOSIS — J019 Acute sinusitis, unspecified: Secondary | ICD-10-CM | POA: Diagnosis not present

## 2013-10-06 DIAGNOSIS — R5381 Other malaise: Secondary | ICD-10-CM | POA: Diagnosis not present

## 2013-10-06 DIAGNOSIS — F411 Generalized anxiety disorder: Secondary | ICD-10-CM | POA: Diagnosis not present

## 2013-10-06 DIAGNOSIS — E7800 Pure hypercholesterolemia, unspecified: Secondary | ICD-10-CM | POA: Diagnosis not present

## 2013-10-06 DIAGNOSIS — R011 Cardiac murmur, unspecified: Secondary | ICD-10-CM | POA: Diagnosis not present

## 2013-10-06 DIAGNOSIS — R5383 Other fatigue: Secondary | ICD-10-CM | POA: Diagnosis not present

## 2013-10-06 DIAGNOSIS — E039 Hypothyroidism, unspecified: Secondary | ICD-10-CM | POA: Diagnosis not present

## 2013-10-20 DIAGNOSIS — Z0000 Encounter for general adult medical examination without abnormal findings: Secondary | ICD-10-CM | POA: Diagnosis not present

## 2013-10-20 DIAGNOSIS — E039 Hypothyroidism, unspecified: Secondary | ICD-10-CM | POA: Diagnosis not present

## 2013-10-20 DIAGNOSIS — H4010X0 Unspecified open-angle glaucoma, stage unspecified: Secondary | ICD-10-CM | POA: Diagnosis not present

## 2013-10-20 DIAGNOSIS — F411 Generalized anxiety disorder: Secondary | ICD-10-CM | POA: Diagnosis not present

## 2013-10-20 DIAGNOSIS — H2510 Age-related nuclear cataract, unspecified eye: Secondary | ICD-10-CM | POA: Diagnosis not present

## 2013-10-20 DIAGNOSIS — G4700 Insomnia, unspecified: Secondary | ICD-10-CM | POA: Diagnosis not present

## 2013-10-23 ENCOUNTER — Ambulatory Visit (INDEPENDENT_AMBULATORY_CARE_PROVIDER_SITE_OTHER): Payer: Medicare Other | Admitting: Cardiology

## 2013-10-23 ENCOUNTER — Telehealth: Payer: Self-pay | Admitting: Cardiology

## 2013-10-23 ENCOUNTER — Encounter: Payer: Self-pay | Admitting: Cardiology

## 2013-10-23 VITALS — BP 143/88 | HR 50 | Ht 62.0 in | Wt 125.5 lb

## 2013-10-23 DIAGNOSIS — Q210 Ventricular septal defect: Secondary | ICD-10-CM

## 2013-10-23 DIAGNOSIS — I2789 Other specified pulmonary heart diseases: Secondary | ICD-10-CM | POA: Diagnosis not present

## 2013-10-23 DIAGNOSIS — M5430 Sciatica, unspecified side: Secondary | ICD-10-CM | POA: Diagnosis not present

## 2013-10-23 DIAGNOSIS — I272 Pulmonary hypertension, unspecified: Secondary | ICD-10-CM

## 2013-10-23 DIAGNOSIS — M999 Biomechanical lesion, unspecified: Secondary | ICD-10-CM | POA: Diagnosis not present

## 2013-10-23 NOTE — Progress Notes (Signed)
Clinical Summary [name] is a 63 y.o.female last seen in September 2014. She continues to follow with Dr. Nadara Mustard, saw him recently by report. She tells me that he mentioned finding her cardiac exam different from before. She also brought a copy of her echocardiogram done last year at [facility], read by the [place] practice, and we went over the results. She was concerned about the elevated pulmonary pressures noted at that time.   Last year's echocardiogram at [facility] reported normal LV wall thickness with LVEF 78-29%, grade 1 diastolic dysfunction, normal RV size and contraction, membranous VSD with left to right shunting, normal right atrial chamber size, no definite ASD or PFO, trace aortic regurgitation, mild tricuspid regurgitation with RVSP 50-55 mm mercury, no pericardial effusion.   She continues to walk an hour each morning, then goes to the [employer] to do water aerobics or water running. She states that her stamina has decreased somewhat over the last year, specifically she has to rest more when she gets home prior to continuing her daily activity. No clear sense of increasing shortness of breath however.  ECG today shows sinus bradycardia.   Allergies  Allergen Reactions  . Sulfa Antibiotics Rash    Current Outpatient Prescriptions  Medication Sig Dispense Refill  . ALPRAZolam (XANAX) 0.25 MG tablet Take 0.25 mg by mouth at bedtime as needed. May take one tablet once every month or two      . diphenhydrAMINE (BENADRYL) 25 MG tablet Take 25 mg by mouth 2 (two) times daily as needed. Patient alternates with Xyzal      . fish oil-omega-3 fatty acids 1000 MG capsule Take 1 g by mouth daily.       Marland Kitchen levocetirizine (XYZAL) 5 MG tablet Take 5 mg by mouth daily.      Marland Kitchen levothyroxine (SYNTHROID, LEVOTHROID) 75 MCG tablet Take 75 mcg by mouth daily.       . montelukast (SINGULAIR) 10 MG tablet Take 10 mg by mouth at bedtime.       . Multiple Vitamin (MULTIVITAMIN) capsule Take 1 capsule  by mouth daily.      . NON FORMULARY Take 1 tablet by mouth daily. Curamin      . NON FORMULARY Take 2 tablets by mouth daily. Joint Bulider      . PATANASE 0.6 % SOLN Place 1 drop into the nose as needed.       . polyethylene glycol powder (GLYCOLAX/MIRALAX) powder Take 17 g by mouth 2 (two) times daily as needed. Once daily then twice daily      . PREMARIN 0.625 MG tablet       . Timothy Grass Pollen Allergen (GRASTEK) 2800 BAU SUBL Place 2,800 BAU/day under the tongue 1 day or 1 dose. April- September       No current facility-administered medications for this visit.    Past Medical History  Diagnosis Date  . Hypothyroidism   . Chronic constipation   . Ventricular septal defect (VSD), membranous     Documented by echocardiogram 2011  . Secondary pulmonary hypertension     PASP 40 mmHg 2011  . Diverticulosis     Past Surgical History  Procedure Laterality Date  . Abdominal hysterectomy    . Breast lumpectomy    . Tonsillectomy    . Colonoscopy   02/03/2003    RMR: Normal rectum and colon  . Colonoscopy N/A 03/10/2013    Dr. Gala Romney-  minimal internal hemorrhoids/ anal papilla o/w normal appearing rectum.  colonic diverticulosis    Family History  Problem Relation Age of Onset  . Colon cancer Neg Hx     Social History Ms. Flett reports that she quit smoking about 45 years ago. Her smoking use included Cigarettes. She smoked 0.50 packs per day. She does not have any smokeless tobacco history on file. Ms. Krukowski reports that she drinks alcohol.  Review of Systems No palpitations or syncope. No cough or hemoptysis. Seasonal allergies. No wheezing. Stable appetite. Other systems reviewed and negative except as outlined.  Physical Examination Filed Vitals:   10/23/13 1301  BP: 143/88  Pulse: 50   Filed Weights   10/23/13 1301  Weight: 125 lb 8 oz (56.926 kg)    Normally nourished appearing woman, appears comfortable at rest.  HEENT: Conjunctiva and lids normal,  oropharynx clear with moist mucosa.  Neck: Supple, no elevated JVP or carotid bruits, no thyromegaly.  Lungs: Clear to auscultation, nonlabored breathing at rest.  Cardiac: Regular rate and rhythm, no S3, 3/6 holosystolic murmur at the base, no diastolic murmur, no pericardial rub. Prominent P2. Abdomen: Soft, nontender, bowel sounds present.  Extremities: No pitting edema, distal pulses 2+.  Skin: Warm and dry.  Musculoskeletal: No kyphosis.  Neuropsychiatric: Alert and oriented x3, affect grossly appropriate.   Problem List and Plan   Ventricular septal defect (VSD), membranous Discussed results of echocardiogram done at [facility] last year again with the patient. It seems like the degree of previously documented pulmonary hypertension (moderate) is out of proportion to her VSD, particularly in light of the fact that she has normal RV size and contraction. She is due for a followup echocardiogram and this will be arranged through our office. Can reassess her pulmonary pressures, and also possibly check QP:QS to assess degree of right-sided shunting. Would also like to get PFTs as well. As noted last year, she does not need SBE prophylaxis by guidelines. Re-faxing copy of last year's office note along with today's note to Dr. Nadara Mustard for his review.  HYPOTHYROIDISM On Synthroid, followed by Dr. Nadara Mustard.    Satira Sark, M.D., F.A.C.C.

## 2013-10-23 NOTE — Assessment & Plan Note (Signed)
On Synthroid, followed by Dr. Nadara Mustard.

## 2013-10-23 NOTE — Patient Instructions (Signed)
Your physician recommends that you schedule a follow-up appointment in: 1 year. You will receive a reminder letter in the mail in about 10 months reminding you to call and schedule your appointment. If you don't receive this letter, please contact our office. Your physician recommends that you continue on your current medications as directed. Please refer to the Current Medication list given to you today. Your physician has requested that you have an echocardiogram. Echocardiography is a painless test that uses sound waves to create images of your heart. It provides your doctor with information about the size and shape of your heart and how well your heart's chambers and valves are working. This procedure takes approximately one hour. There are no restrictions for this procedure. Your physician has recommended that you have a pulmonary function test. Pulmonary Function Tests are a group of tests that measure how well air moves in and out of your lungs.

## 2013-10-23 NOTE — Telephone Encounter (Signed)
PFT's in November 2015 dx: pulmonary htn Scheduled at [facility]  11/6 @ 8am

## 2013-10-23 NOTE — Assessment & Plan Note (Signed)
Discussed results of echocardiogram done at [facility] last year again with the patient. It seems like the degree of previously documented pulmonary hypertension (moderate) is out of proportion to her VSD, particularly in light of the fact that she has normal RV size and contraction. She is due for a followup echocardiogram and this will be arranged through our office. Can reassess her pulmonary pressures, and also possibly check QP:QS to assess degree of right-sided shunting. Would also like to get PFTs as well. As noted last year, she does not need SBE prophylaxis by guidelines. Re-faxing copy of last year's office note along with today's note to Dr. Nadara Mustard for his review.

## 2013-10-30 ENCOUNTER — Telehealth: Payer: Self-pay | Admitting: *Deleted

## 2013-10-30 NOTE — Telephone Encounter (Signed)
I'm calling because I want to cancel my surgery scheduled for November 3rd.  If you could give me a call please.  I returned her call and asked if she wanted to reschedule.  She stated, "My foot is doing much much better.  If it continues to improve I will not need it.  My foot has felt better since I left there.  So no, I don't want to reschedule.  I will call if it gets worse to reschedule."  I told her I will cancel it at the surgical center and inform Dr. Paulla Dolly.   I called and left Caren Griffins a message at [facility] about the cancellation for 12/01/2013.

## 2013-11-02 DIAGNOSIS — M545 Low back pain: Secondary | ICD-10-CM | POA: Diagnosis not present

## 2013-11-04 ENCOUNTER — Other Ambulatory Visit: Payer: Self-pay

## 2013-11-04 ENCOUNTER — Other Ambulatory Visit (INDEPENDENT_AMBULATORY_CARE_PROVIDER_SITE_OTHER): Payer: Medicare Other

## 2013-11-04 DIAGNOSIS — Q210 Ventricular septal defect: Secondary | ICD-10-CM | POA: Diagnosis not present

## 2013-11-04 DIAGNOSIS — I270 Primary pulmonary hypertension: Secondary | ICD-10-CM | POA: Diagnosis not present

## 2013-11-04 DIAGNOSIS — I272 Pulmonary hypertension, unspecified: Secondary | ICD-10-CM

## 2013-11-06 ENCOUNTER — Telehealth: Payer: Self-pay | Admitting: *Deleted

## 2013-11-06 DIAGNOSIS — Q210 Ventricular septal defect: Secondary | ICD-10-CM

## 2013-11-06 NOTE — Telephone Encounter (Signed)
Patient informed and is unavailable to have limited echo done today. Patient said she is out of town and would call back once she was in town to reschedule.

## 2013-11-06 NOTE — Telephone Encounter (Signed)
Message copied by Merlene Laughter on Fri Nov 06, 2013  1:51 PM ------      Message from: MCDOWELL, Aloha Gell      Created: Thu Nov 05, 2013 12:56 PM       I reviewed the report, also reviewed the actual study images. Unfortunately, not able to obtain the measurements that I was most interested in. Some of the Doppler views were not on the appropriate axis, and could not get a good measurement of PA pressure. My recommendation is to repeat a limited echocardiogram with specific attention to obtaining Qp:Qs, PASP, CVP, RV pressure based on the VSD gradient. I have spoken with Jenny Reichmann in the [facility] echocardiogram lab. This study could potentially be performed tomorrow if the patient has time, and I can review the images myself. This should be performed at no charge to her. ------

## 2013-11-11 ENCOUNTER — Ambulatory Visit (HOSPITAL_COMMUNITY): Payer: Medicare Other | Attending: Cardiology

## 2013-11-30 DIAGNOSIS — M1990 Unspecified osteoarthritis, unspecified site: Secondary | ICD-10-CM | POA: Diagnosis not present

## 2013-11-30 DIAGNOSIS — Z79899 Other long term (current) drug therapy: Secondary | ICD-10-CM | POA: Diagnosis not present

## 2013-11-30 DIAGNOSIS — M810 Age-related osteoporosis without current pathological fracture: Secondary | ICD-10-CM | POA: Diagnosis not present

## 2013-11-30 DIAGNOSIS — Z780 Asymptomatic menopausal state: Secondary | ICD-10-CM | POA: Diagnosis not present

## 2013-11-30 DIAGNOSIS — M85851 Other specified disorders of bone density and structure, right thigh: Secondary | ICD-10-CM | POA: Diagnosis not present

## 2013-11-30 DIAGNOSIS — M85852 Other specified disorders of bone density and structure, left thigh: Secondary | ICD-10-CM | POA: Diagnosis not present

## 2013-11-30 DIAGNOSIS — E039 Hypothyroidism, unspecified: Secondary | ICD-10-CM | POA: Diagnosis not present

## 2013-12-01 NOTE — Telephone Encounter (Signed)
Patient rescheduled PFT for Nov 24th at 3pm at [facility]

## 2013-12-01 NOTE — Telephone Encounter (Signed)
Patient has been rescheduled for ECHO at [facility] Nov 12 @ 3pm - per notes at NO CHARGE   PFT also rescheduled for Nov 24th at 3pm at [facility]

## 2013-12-02 ENCOUNTER — Other Ambulatory Visit: Payer: Medicare Other

## 2013-12-10 ENCOUNTER — Ambulatory Visit (HOSPITAL_COMMUNITY)
Admission: RE | Admit: 2013-12-10 | Discharge: 2013-12-10 | Disposition: A | Discharge status: Home / Self Care | Payer: Medicare Other | Source: Ambulatory Visit | Attending: Cardiology | Admitting: Cardiology

## 2013-12-10 DIAGNOSIS — Q210 Ventricular septal defect: Secondary | ICD-10-CM

## 2013-12-10 DIAGNOSIS — I510 Cardiac septal defect, acquired: Secondary | ICD-10-CM | POA: Diagnosis not present

## 2013-12-10 NOTE — Progress Notes (Signed)
*  PRELIMINARY RESULTS* Echocardiogram 2D Echocardiogram limited has been performed. Per Dr. Domenic Polite, echo no charge. Needed to reevaluate VSD and PASP.  [place], [place] 12/10/2013, 4:34 PM

## 2013-12-11 ENCOUNTER — Telehealth: Payer: Self-pay | Admitting: *Deleted

## 2013-12-11 NOTE — Telephone Encounter (Signed)
Patient informed. 

## 2013-12-11 NOTE — Telephone Encounter (Signed)
-----   Message from Satira Sark, MD sent at 12/11/2013  7:43 AM EST ----- Reviewed. Please let her know that I reviewed the images and the VSD does not look to be causing any major degree of right heart strain to be an explanation for shortness of breath. Pulmonary pressures are only mildly increased at this point. I would recommend that she continue to exercise, and we will follow this over time.

## 2013-12-18 ENCOUNTER — Telehealth: Payer: Self-pay | Admitting: *Deleted

## 2013-12-18 NOTE — Telephone Encounter (Signed)
Explained to patient that PFT's were ordered to check for any lung functioning problems. Patient verbalized understanding.

## 2013-12-22 ENCOUNTER — Ambulatory Visit (HOSPITAL_COMMUNITY)
Admission: RE | Admit: 2013-12-22 | Discharge: 2013-12-22 | Disposition: A | Discharge status: Home / Self Care | Payer: Medicare Other | Source: Ambulatory Visit | Attending: Cardiology | Admitting: Cardiology

## 2013-12-22 DIAGNOSIS — I272 Pulmonary hypertension, unspecified: Secondary | ICD-10-CM

## 2013-12-22 DIAGNOSIS — I270 Primary pulmonary hypertension: Secondary | ICD-10-CM | POA: Insufficient documentation

## 2013-12-22 LAB — PULMONARY FUNCTION TEST
DL/VA % pred: 105 %
DL/VA: 4.81 ml/min/mmHg/L
DLCO COR % PRED: 80 %
DLCO COR: 17.29 ml/min/mmHg
DLCO UNC: 17.29 ml/min/mmHg
DLCO unc % pred: 80 %
FEF 25-75 PRE: 1.72 L/s
FEF 25-75 Post: 2.4 L/sec
FEF2575-%CHANGE-POST: 39 %
FEF2575-%PRED-PRE: 86 %
FEF2575-%Pred-Post: 120 %
FEV1-%CHANGE-POST: 11 %
FEV1-%PRED-POST: 89 %
FEV1-%Pred-Pre: 80 %
FEV1-PRE: 1.77 L
FEV1-Post: 1.98 L
FEV1FVC-%Change-Post: 4 %
FEV1FVC-%PRED-PRE: 102 %
FEV6-%Change-Post: 6 %
FEV6-%PRED-POST: 86 %
FEV6-%Pred-Pre: 80 %
FEV6-Post: 2.4 L
FEV6-Pre: 2.25 L
FEV6FVC-%Pred-Post: 104 %
FEV6FVC-%Pred-Pre: 104 %
FVC-%CHANGE-POST: 6 %
FVC-%Pred-Post: 82 %
FVC-%Pred-Pre: 77 %
FVC-POST: 2.4 L
FVC-Pre: 2.25 L
Post FEV1/FVC ratio: 83 %
Post FEV6/FVC ratio: 100 %
Pre FEV1/FVC ratio: 79 %
Pre FEV6/FVC Ratio: 100 %
RV % pred: 103 %
RV: 2.06 L
TLC % pred: 88 %
TLC: 4.19 L

## 2013-12-22 MED ORDER — ALBUTEROL SULFATE (2.5 MG/3ML) 0.083% IN NEBU
2.5000 mg | INHALATION_SOLUTION | Freq: Once | RESPIRATORY_TRACT | Status: AC
  Administered 2013-12-22: 2.5 mg via RESPIRATORY_TRACT

## 2013-12-23 ENCOUNTER — Telehealth: Payer: Self-pay | Admitting: *Deleted

## 2013-12-23 NOTE — Telephone Encounter (Signed)
-----   Message from Satira Sark, MD sent at 12/22/2013  2:48 PM EST ----- Reviewed the preliminary report. Let her know that the test looks good overall.

## 2013-12-28 NOTE — Telephone Encounter (Signed)
Patient informed. 

## 2013-12-31 DIAGNOSIS — J0100 Acute maxillary sinusitis, unspecified: Secondary | ICD-10-CM | POA: Diagnosis not present

## 2014-02-03 DIAGNOSIS — M47816 Spondylosis without myelopathy or radiculopathy, lumbar region: Secondary | ICD-10-CM | POA: Diagnosis not present

## 2014-02-03 DIAGNOSIS — M9903 Segmental and somatic dysfunction of lumbar region: Secondary | ICD-10-CM | POA: Diagnosis not present

## 2014-02-26 ENCOUNTER — Other Ambulatory Visit: Payer: Self-pay

## 2014-02-26 DIAGNOSIS — Z1231 Encounter for screening mammogram for malignant neoplasm of breast: Secondary | ICD-10-CM

## 2014-03-08 ENCOUNTER — Ambulatory Visit
Admission: RE | Admit: 2014-03-08 | Discharge: 2014-03-08 | Disposition: A | Discharge status: Home / Self Care | Payer: Medicare Other | Source: Ambulatory Visit

## 2014-03-08 DIAGNOSIS — Z1231 Encounter for screening mammogram for malignant neoplasm of breast: Secondary | ICD-10-CM | POA: Diagnosis not present

## 2014-03-11 DIAGNOSIS — Z1272 Encounter for screening for malignant neoplasm of vagina: Secondary | ICD-10-CM | POA: Diagnosis not present

## 2014-03-11 DIAGNOSIS — Z01419 Encounter for gynecological examination (general) (routine) without abnormal findings: Secondary | ICD-10-CM | POA: Diagnosis not present

## 2014-03-11 DIAGNOSIS — R1032 Left lower quadrant pain: Secondary | ICD-10-CM | POA: Diagnosis not present

## 2014-03-30 DIAGNOSIS — H1045 Other chronic allergic conjunctivitis: Secondary | ICD-10-CM | POA: Diagnosis not present

## 2014-03-30 DIAGNOSIS — J309 Allergic rhinitis, unspecified: Secondary | ICD-10-CM | POA: Diagnosis not present

## 2014-04-01 DIAGNOSIS — Z1283 Encounter for screening for malignant neoplasm of skin: Secondary | ICD-10-CM | POA: Diagnosis not present

## 2014-04-02 DIAGNOSIS — J302 Other seasonal allergic rhinitis: Secondary | ICD-10-CM | POA: Diagnosis not present

## 2014-04-02 DIAGNOSIS — J019 Acute sinusitis, unspecified: Secondary | ICD-10-CM | POA: Diagnosis not present

## 2014-05-05 DIAGNOSIS — Z1389 Encounter for screening for other disorder: Secondary | ICD-10-CM | POA: Diagnosis not present

## 2014-05-05 DIAGNOSIS — E038 Other specified hypothyroidism: Secondary | ICD-10-CM | POA: Diagnosis not present

## 2014-05-05 DIAGNOSIS — F328 Other depressive episodes: Secondary | ICD-10-CM | POA: Diagnosis not present

## 2014-05-05 DIAGNOSIS — F418 Other specified anxiety disorders: Secondary | ICD-10-CM | POA: Diagnosis not present

## 2014-05-05 DIAGNOSIS — M810 Age-related osteoporosis without current pathological fracture: Secondary | ICD-10-CM | POA: Diagnosis not present

## 2014-05-24 DIAGNOSIS — H40003 Preglaucoma, unspecified, bilateral: Secondary | ICD-10-CM | POA: Diagnosis not present

## 2014-05-31 DIAGNOSIS — H40003 Preglaucoma, unspecified, bilateral: Secondary | ICD-10-CM | POA: Diagnosis not present

## 2014-06-03 DIAGNOSIS — H2513 Age-related nuclear cataract, bilateral: Secondary | ICD-10-CM | POA: Diagnosis not present

## 2014-06-03 DIAGNOSIS — H40003 Preglaucoma, unspecified, bilateral: Secondary | ICD-10-CM | POA: Diagnosis not present

## 2014-06-21 ENCOUNTER — Other Ambulatory Visit: Payer: Self-pay

## 2014-06-21 MED ORDER — POLYETHYLENE GLYCOL 3350 17 GM/SCOOP PO POWD: ORAL | Status: DC

## 2014-08-09 DIAGNOSIS — M546 Pain in thoracic spine: Secondary | ICD-10-CM | POA: Diagnosis not present

## 2014-08-09 DIAGNOSIS — M9902 Segmental and somatic dysfunction of thoracic region: Secondary | ICD-10-CM | POA: Diagnosis not present

## 2014-08-09 DIAGNOSIS — S134XXA Sprain of ligaments of cervical spine, initial encounter: Secondary | ICD-10-CM | POA: Diagnosis not present

## 2014-08-09 DIAGNOSIS — M9903 Segmental and somatic dysfunction of lumbar region: Secondary | ICD-10-CM | POA: Diagnosis not present

## 2014-08-09 DIAGNOSIS — M4004 Postural kyphosis, thoracic region: Secondary | ICD-10-CM | POA: Diagnosis not present

## 2014-08-09 DIAGNOSIS — M531 Cervicobrachial syndrome: Secondary | ICD-10-CM | POA: Diagnosis not present

## 2014-08-09 DIAGNOSIS — S335XXA Sprain of ligaments of lumbar spine, initial encounter: Secondary | ICD-10-CM | POA: Diagnosis not present

## 2014-08-09 DIAGNOSIS — M9901 Segmental and somatic dysfunction of cervical region: Secondary | ICD-10-CM | POA: Diagnosis not present

## 2014-08-10 DIAGNOSIS — S134XXA Sprain of ligaments of cervical spine, initial encounter: Secondary | ICD-10-CM | POA: Diagnosis not present

## 2014-08-10 DIAGNOSIS — H2513 Age-related nuclear cataract, bilateral: Secondary | ICD-10-CM | POA: Diagnosis not present

## 2014-08-10 DIAGNOSIS — M9902 Segmental and somatic dysfunction of thoracic region: Secondary | ICD-10-CM | POA: Diagnosis not present

## 2014-08-10 DIAGNOSIS — M47812 Spondylosis without myelopathy or radiculopathy, cervical region: Secondary | ICD-10-CM | POA: Diagnosis not present

## 2014-08-10 DIAGNOSIS — M546 Pain in thoracic spine: Secondary | ICD-10-CM | POA: Diagnosis not present

## 2014-08-10 DIAGNOSIS — M9903 Segmental and somatic dysfunction of lumbar region: Secondary | ICD-10-CM | POA: Diagnosis not present

## 2014-08-10 DIAGNOSIS — M4004 Postural kyphosis, thoracic region: Secondary | ICD-10-CM | POA: Diagnosis not present

## 2014-08-10 DIAGNOSIS — S335XXA Sprain of ligaments of lumbar spine, initial encounter: Secondary | ICD-10-CM | POA: Diagnosis not present

## 2014-08-10 DIAGNOSIS — M9901 Segmental and somatic dysfunction of cervical region: Secondary | ICD-10-CM | POA: Diagnosis not present

## 2014-08-10 DIAGNOSIS — M531 Cervicobrachial syndrome: Secondary | ICD-10-CM | POA: Diagnosis not present

## 2014-08-19 DIAGNOSIS — R079 Chest pain, unspecified: Secondary | ICD-10-CM | POA: Diagnosis not present

## 2014-08-24 ENCOUNTER — Encounter: Payer: Self-pay | Admitting: Cardiology

## 2014-08-25 ENCOUNTER — Telehealth: Payer: Self-pay | Admitting: Cardiology

## 2014-08-25 DIAGNOSIS — R072 Precordial pain: Secondary | ICD-10-CM

## 2014-08-25 NOTE — Telephone Encounter (Signed)
Please go ahead and arrange an exercise Cardiolite for assessment of precordial pain. Schedule office visit in a few weeks to review. She had an echocardiogram in November 2015 so do not need to repeat this now.

## 2014-08-25 NOTE — Telephone Encounter (Signed)
Patient was seen by Dr. Nadara Mustard 08/19/14 for evaluation of chest pains. Dr. Nadara Mustard is requesting that patient be seen and possibly have a stress test.  Mrs. Chavers wants to have her testing thru [facility].  Dr. Bronson Ing reviewed and advised to set up appointment with Dr. Domenic Polite. Spoke with Mrs. Bossman today. She will be out of town the 1st week of August. Will forward notes to Dr. Domenic Polite for review and to advise if patient can go ahead With a stress test or needs office follow up.  Please call patient on cell # [phone] for further instructions. Will place office notes in Dr. Myles Gip Office basket for his review.

## 2014-08-27 ENCOUNTER — Telehealth: Payer: Self-pay | Admitting: Cardiology

## 2014-08-27 NOTE — Telephone Encounter (Signed)
Exercise Cardiolite/precordial pain Scheduled Aug 8th arrive at 8:45 at [facility]

## 2014-08-27 NOTE — Telephone Encounter (Signed)
Patient notified.  Will put order in for stress test & forward for scheduling.    Patient given instructions to not have anything to eat/dring x 4 hours prior to test & no caffeine 24 hours prior.  She may take all medications morning of test as she is not on any meds that would affect her heart rate.

## 2014-08-30 NOTE — Telephone Encounter (Signed)
No precert required.  Pt has MCR and [employer] MCR sup

## 2014-09-06 ENCOUNTER — Encounter (HOSPITAL_COMMUNITY)
Admission: RE | Admit: 2014-09-06 | Discharge: 2014-09-06 | Disposition: A | Discharge status: Home / Self Care | Payer: Medicare Other | Source: Ambulatory Visit | Attending: Cardiology | Admitting: Cardiology

## 2014-09-06 ENCOUNTER — Encounter (HOSPITAL_COMMUNITY): Payer: Self-pay

## 2014-09-06 ENCOUNTER — Inpatient Hospital Stay (HOSPITAL_COMMUNITY): Admission: RE | Admit: 2014-09-06 | Payer: Medicare PPO | Source: Ambulatory Visit

## 2014-09-06 DIAGNOSIS — R072 Precordial pain: Secondary | ICD-10-CM | POA: Insufficient documentation

## 2014-09-06 LAB — NM MYOCAR MULTI W/SPECT W/WALL MOTION / EF
CSEPEW: 12.4 METS
Exercise duration (min): 10 min
Exercise duration (sec): 15 s
LHR: 0
LVDIAVOL: 68 mL
LVSYSVOL: 18 mL
MPHR: 154 {beats}/min
Peak HR: 133 {beats}/min
Percent HR: 86 %
RPE: 13
Rest HR: 53 {beats}/min
SDS: 0
SRS: 0
SSS: 0
TID: 1.01

## 2014-09-06 MED ORDER — TECHNETIUM TC 99M SESTAMIBI GENERIC - CARDIOLITE
10.0000 | Freq: Once | INTRAVENOUS | Status: AC | PRN
  Administered 2014-09-06: 10 via INTRAVENOUS

## 2014-09-06 MED ORDER — TECHNETIUM TC 99M SESTAMIBI - CARDIOLITE
30.0000 | Freq: Once | INTRAVENOUS | Status: AC | PRN
  Administered 2014-09-06: 12:00:00 30 via INTRAVENOUS

## 2014-09-06 MED ORDER — REGADENOSON 0.4 MG/5ML IV SOLN
INTRAVENOUS | Status: AC
  Filled 2014-09-06: qty 5

## 2014-09-06 MED ORDER — SODIUM CHLORIDE 0.9 % IJ SOLN
INTRAMUSCULAR | Status: AC
  Administered 2014-09-06: 10 mL via INTRAVENOUS
  Filled 2014-09-06: qty 3

## 2014-09-07 ENCOUNTER — Telehealth: Payer: Self-pay | Admitting: *Deleted

## 2014-09-07 NOTE — Telephone Encounter (Signed)
Patient informed. 

## 2014-09-07 NOTE — Telephone Encounter (Signed)
-----   Message from Satira Sark, MD sent at 09/06/2014  9:16 PM EDT ----- Reviewed report and images. Please let her know stress test looked good overall. Low risk for obstructive CAD. Can discuss further at office visit.

## 2014-09-08 DIAGNOSIS — L259 Unspecified contact dermatitis, unspecified cause: Secondary | ICD-10-CM | POA: Diagnosis not present

## 2014-09-08 DIAGNOSIS — L578 Other skin changes due to chronic exposure to nonionizing radiation: Secondary | ICD-10-CM | POA: Diagnosis not present

## 2014-09-27 ENCOUNTER — Ambulatory Visit: Payer: Medicare PPO | Admitting: Cardiology

## 2014-10-05 ENCOUNTER — Ambulatory Visit (INDEPENDENT_AMBULATORY_CARE_PROVIDER_SITE_OTHER): Payer: Medicare Other | Admitting: Cardiology

## 2014-10-05 ENCOUNTER — Encounter: Payer: Self-pay | Admitting: Cardiology

## 2014-10-05 VITALS — BP 140/79 | HR 59 | Ht 62.0 in | Wt 127.8 lb

## 2014-10-05 DIAGNOSIS — R0602 Shortness of breath: Secondary | ICD-10-CM | POA: Diagnosis not present

## 2014-10-05 DIAGNOSIS — R072 Precordial pain: Secondary | ICD-10-CM | POA: Diagnosis not present

## 2014-10-05 DIAGNOSIS — Q210 Ventricular septal defect: Secondary | ICD-10-CM

## 2014-10-05 NOTE — Progress Notes (Signed)
Cardiology Office Note  Date: 10/05/2014   ID: [name], DOB 11-28-1950, MRN [id number]  PCP: Rory Percy, MD  Primary Cardiologist: Rozann Lesches, MD   Chief Complaint  Patient presents with  . Follow-up testing    History of Present Illness:  [name] is a 64 y.o. female last seen in September 2015. Since last evaluation she has undergone follow-up testing, reviewed below. Within the last year she has experienced episodes of exertional chest tightness, not consistent however. Echocardiogram from late last year did not demonstrate any substantial degree of right heart strain with only mildly increased PASP in the setting of known membranous VSD.Marland Kitchen She had PFTs around that time which were overall fairly reassuring. She did undergo an exercise Cardiolite as reviewed below, hypertensive response noted with exercise and nonspecific T-wave changes, but overall low risk with no focal ischemia by perfusion imaging and normal LVEF. We discussed the results today.  She reports no major escalation in symptoms. She has also been having some leg discomfort, although feels that the symptoms are better after increasing her potassium intake.  She does not check her blood pressure regularly, I wonder whether the trend has been increasing with associated exertional hypertension which could be provoking some symptoms. She states that she has a full physical pending in the next month with lab work per PCP. We also discussed the possibility of considering a heart catheterization if her symptoms persist without other obvious etiology.  Past Medical History  Diagnosis Date  . Hypothyroidism   . Chronic constipation   . Ventricular septal defect (VSD), membranous     Documented by echocardiogram 2011  . Secondary pulmonary hypertension     PASP 40 mmHg 2011  . Diverticulosis     Past Surgical History  Procedure Laterality Date  . Abdominal hysterectomy    . Breast lumpectomy    .  Tonsillectomy    . Colonoscopy   02/03/2003    RMR: Normal rectum and colon  . Colonoscopy N/A 03/10/2013    Dr. Gala Romney-  minimal internal hemorrhoids/ anal papilla o/w normal appearing rectum. colonic diverticulosis    Current Outpatient Prescriptions  Medication Sig Dispense Refill  . ALPRAZolam (XANAX) 0.25 MG tablet Take 0.25 mg by mouth at bedtime as needed. May take one tablet once every month or two    . diphenhydrAMINE (BENADRYL) 25 MG tablet Take 25 mg by mouth 2 (two) times daily as needed. Patient alternates with Xyzal    . fish oil-omega-3 fatty acids 1000 MG capsule Take 1 g by mouth daily.     Marland Kitchen levocetirizine (XYZAL) 5 MG tablet Take 5 mg by mouth daily.    Marland Kitchen levothyroxine (SYNTHROID, LEVOTHROID) 75 MCG tablet Take 75 mcg by mouth daily.     . montelukast (SINGULAIR) 10 MG tablet Take 10 mg by mouth at bedtime.     . Multiple Vitamin (MULTIVITAMIN) capsule Take 1 capsule by mouth daily.    . NON FORMULARY Take 1 tablet by mouth daily. Curamin    . NON FORMULARY Take 2 tablets by mouth daily. Joint Bulider    . PATANASE 0.6 % SOLN Place 1 drop into the nose as needed.     . polyethylene glycol powder (GLYCOLAX/MIRALAX) powder Take one to two capfuls nightly as needed. 527 g 5  . PREMARIN 0.625 MG tablet Take 0.625 mg by mouth daily.      No current facility-administered medications for this visit.    Allergies:  Sulfa  antibiotics   Social History: The patient  reports that she quit smoking about 45 years ago. Her smoking use included Cigarettes. She smoked 0.50 packs per day. She does not have any smokeless tobacco history on file. She reports that she drinks alcohol. She reports that she does not use illicit drugs.   ROS:  Please see the history of present illness. Otherwise, complete review of systems is positive for none.  All other systems are reviewed and negative.   Physical Exam: VS:  BP 140/79 mmHg  Pulse 59  Ht 5\' 2"  (1.575 m)  Wt 127 lb 12.8 oz (57.97 kg)   BMI 23.37 kg/m2  SpO2 100%, BMI Body mass index is 23.37 kg/(m^2).  Wt Readings from Last 3 Encounters:  10/05/14 127 lb 12.8 oz (57.97 kg)  10/23/13 125 lb 8 oz (56.926 kg)  06/16/13 125 lb (56.7 kg)     General: Patient appears comfortable at rest. HEENT: Conjunctiva and lids normal, oropharynx clear with moist mucosa. Neck: Supple, no elevated JVP or carotid bruits, no thyromegaly. Lungs: Clear to auscultation, nonlabored breathing at rest. Cardiac: Regular rate and rhythm, no S3 or significant systolic murmur, no pericardial rub. Abdomen: Soft, nontender, no hepatomegaly, bowel sounds present, no guarding or rebound. Extremities: No pitting edema, distal pulses 2+. Skin: Warm and dry. Musculoskeletal: No kyphosis. Neuropsychiatric: Alert and oriented x3, affect grossly appropriate.   ECG: ECG is not ordered today. Reviewed recent stress test tracings.   Other Studies Reviewed Today:  Echocardiogram 12/10/2013: Study Conclusions  - Left ventricle: The cavity size was normal. Systolic function was normal. The estimated ejection fraction was in the range of 60% to 65%. Wall motion was normal; there were no regional wall motion abnormalities. - Aortic valve: There was trivial regurgitation. - Mitral valve: Calcified annulus. - Right ventricle: Systolic function was normal. - Right atrium: Central venous pressure (est): 3 mm Hg. - Tricuspid valve: There was trivial regurgitation. - Pulmonary arteries: PA peak pressure: 41 mm Hg (S).  Impressions:  - Limited study. LVEF 00-86%, diastolic function not assessed. Trivial aortic regurgitation. Mild mitral regurgitation. Trivial tricuspid regurgitation. PASP calculated at 41 mmHg. Qp:QS ranged from 1.1-1.4 based on different calculations. Membranous VSD identified as before with left to right shunting. Direction of shunt is curved and difficult to fully evaluate by Doppler with incomplete envelopes despite  multiple images. Peak obtained gradient was 70 mmHg although this looked to be underestimated as well. RV contraction is normal.  Exercise Cardiolite 09/06/2014:  Blood pressure demonstrated a hypertensive response to exercise.  T wave inversion was noted during stress in the V6, II, III, aVF and V5 leads, beginning at 6 minutes of stress, and returning to baseline after 5-9 mins of recovery.  This is a low risk study. Normal myocardial perfusion.  The left ventricular ejection fraction is hyperdynamic (>65%).  Nuclear stress EF: 73%.   Assessment and Plan:  1. Intermittent exertional chest tightness. Etiology not entirely clear as yet. Could be component of exertional hypertension. I asked her to get a home blood pressure cuff and check her blood pressure prior to the pending full physical with Dr. Nadara Mustard. It may be that a low-dose antihypertensive would be beneficial. Even though her stress test was overall low risk, we did discuss the possibility of considering a heart catheterization if symptoms persist and no other etiology is found.  2. Known membranous VSD. Due for follow-up echocardiogram prior to next visit within the next few months. Seems unlikely that this  is symptom provoking based on evaluation over time and relative stability.  Current medicines were reviewed with the patient today.   Orders Placed This Encounter  Procedures  . ECHOCARDIOGRAM COMPLETE    Disposition: FU with me in 2 months.   Signed, Satira Sark, MD, [facility] 10/05/2014 4:31 PM    [facility] [address], [place], [place] [address] Phone: [phone]; Fax: [phone]

## 2014-10-05 NOTE — Patient Instructions (Signed)
Your physician recommends that you continue on your current medications as directed. Please refer to the Current Medication list given to you today. Your physician has requested that you have an echocardiogram. Echocardiography is a painless test that uses sound waves to create images of your heart. It provides your doctor with information about the size and shape of your heart and how well your heart's chambers and valves are working. This procedure takes approximately one hour. There are no restrictions for this procedure. Your physician recommends that you schedule a follow-up appointment in: 2 months.

## 2014-10-18 DIAGNOSIS — E780 Pure hypercholesterolemia: Secondary | ICD-10-CM | POA: Diagnosis not present

## 2014-10-18 DIAGNOSIS — Z0000 Encounter for general adult medical examination without abnormal findings: Secondary | ICD-10-CM | POA: Diagnosis not present

## 2014-10-18 DIAGNOSIS — E038 Other specified hypothyroidism: Secondary | ICD-10-CM | POA: Diagnosis not present

## 2014-10-27 DIAGNOSIS — Z0000 Encounter for general adult medical examination without abnormal findings: Secondary | ICD-10-CM | POA: Diagnosis not present

## 2014-10-27 DIAGNOSIS — F418 Other specified anxiety disorders: Secondary | ICD-10-CM | POA: Diagnosis not present

## 2014-10-27 DIAGNOSIS — F5101 Primary insomnia: Secondary | ICD-10-CM | POA: Diagnosis not present

## 2014-10-27 DIAGNOSIS — E038 Other specified hypothyroidism: Secondary | ICD-10-CM | POA: Diagnosis not present

## 2014-10-27 DIAGNOSIS — M810 Age-related osteoporosis without current pathological fracture: Secondary | ICD-10-CM | POA: Diagnosis not present

## 2014-10-27 DIAGNOSIS — E780 Pure hypercholesterolemia: Secondary | ICD-10-CM | POA: Diagnosis not present

## 2014-10-27 DIAGNOSIS — I350 Nonrheumatic aortic (valve) stenosis: Secondary | ICD-10-CM | POA: Diagnosis not present

## 2014-11-16 ENCOUNTER — Encounter: Payer: Self-pay | Admitting: Allergy and Immunology

## 2014-11-16 ENCOUNTER — Ambulatory Visit (INDEPENDENT_AMBULATORY_CARE_PROVIDER_SITE_OTHER): Payer: Medicare Other | Admitting: Allergy and Immunology

## 2014-11-16 VITALS — BP 118/76 | HR 76 | Temp 97.7°F | Resp 16

## 2014-11-16 DIAGNOSIS — R059 Cough, unspecified: Secondary | ICD-10-CM

## 2014-11-16 DIAGNOSIS — J309 Allergic rhinitis, unspecified: Secondary | ICD-10-CM | POA: Diagnosis not present

## 2014-11-16 DIAGNOSIS — L299 Pruritus, unspecified: Secondary | ICD-10-CM | POA: Diagnosis not present

## 2014-11-16 DIAGNOSIS — H1010 Acute atopic conjunctivitis, unspecified eye: Secondary | ICD-10-CM | POA: Diagnosis not present

## 2014-11-16 DIAGNOSIS — R05 Cough: Secondary | ICD-10-CM | POA: Diagnosis not present

## 2014-11-16 MED ORDER — OLOPATADINE HCL 0.7 % OP SOLN: 1.0000 [drp] | OPHTHALMIC | Status: DC

## 2014-11-16 MED ORDER — MONTELUKAST SODIUM 10 MG PO TABS: 10.0000 mg | ORAL_TABLET | Freq: Every day | ORAL | Status: DC

## 2014-11-16 MED ORDER — OLOPATADINE HCL 0.6 % NA SOLN: 1.0000 [drp] | NASAL | Status: DC | PRN

## 2014-11-16 MED ORDER — LEVOCETIRIZINE DIHYDROCHLORIDE 5 MG PO TABS: 5.0000 mg | ORAL_TABLET | Freq: Every day | ORAL | Status: DC

## 2014-11-16 MED ORDER — ALBUTEROL SULFATE HFA 108 (90 BASE) MCG/ACT IN AERS: 2.0000 | INHALATION_SPRAY | Freq: Four times a day (QID) | RESPIRATORY_TRACT | Status: DC | PRN

## 2014-11-16 NOTE — Patient Instructions (Signed)
Take Home Sheet  1. Avoidance: Mite, Mold and Pollen   2. Antihistamine: Xyzal 5mg  by mouth once daily for runny nose or itching.   3. Nasal Spray: Patanase 1-2 spray(s) each nostril twice daily for stuffy nose or drainage.    4.  Continue Singulair 10mg  daily.   5. Eye Drops: Pazeo one drop(s) each eye once daily for itchy eyes as needed.   6. Other: Review information on Oralair.      If new skin concerns take pictures and write down environment, exposure, ingestion and activity.  7. Nasal Saline wash each evening at bath/shower time daily.   8. Follow up Visit:  December  2016 for start of Sublingual tablet.   Websites that have reliable Patient information: 1. American Academy of Asthma, Allergy, & Immunology: www.aaaai.org 2. Food Allergy Network: www.foodallergy.org 3. Mothers of Asthmatics: www.aanma.org 4. [place]: DiningCalendar.de 5. American College of Allergy, Asthma, & Immunology: https://robertson.info/ or www.acaai.org  Control of House Dust Mite Allergen  House dust mites play a major role in allergic asthma and rhinitis.  They occur in environments with high humidity wherever human skin, the food for dust mites is found. High levels have been detected in dust obtained from mattresses, pillows, carpets, upholstered furniture, bed covers, clothes and soft toys.  The principal allergen of the house dust mite is found in its feces.  A gram of dust may contain 1,000 mites and 250,000 fecal particles.  Mite antigen is easily measured in the air during house cleaning activities.  1. Encase mattresses, including the box spring, and pillow, in an air tight cover.  Seal the zipper end of the encased mattresses with wide adhesive tape. 2. Wash the bedding in water of 130 degrees Farenheit weekly.  Avoid cotton comforters/quilts and flannel bedding: the most ideal bed covering is the dacron comforter. 3. Remove all upholstered furniture from the  bedroom. 4. Remove carpets, carpet padding, rugs, and non-washable window drapes from the bedroom.  Wash drapes weekly or use plastic window coverings. 5. Remove all non-washable stuffed toys from the bedroom.  Wash stuffed toys weekly. 6. Have the room cleaned frequently with a vacuum cleaner and a damp dust-mop.  The patient should not be in a room which is being cleaned and should wait 1 hour after cleaning before going into the room. 7. Close and seal all heating outlets in the bedroom.  Otherwise, the room will become filled with dust-laden air.  An electric heater can be used to heat the room. 8. Reduce indoor humidity to less than 50%.  Do not use a humidifier.  Reducing Pollen Exposure  The American Academy of Allergy, Asthma and Immunology suggests the following steps to reduce your exposure to pollen during allergy seasons.  9. Do not hang sheets or clothing out to dry; pollen may collect on these items. 10. Do not mow lawns or spend time around freshly cut grass; mowing stirs up pollen. 11. Keep windows closed at night.  Keep car windows closed while driving. 12. Minimize morning activities outdoors, a time when pollen counts are usually at their highest. 13. Stay indoors as much as possible when pollen counts or humidity is high and on windy days when pollen tends to remain in the air longer. 14. Use air conditioning when possible.  Many air conditioners have filters that trap the pollen spores. 15. Use a HEPA room air filter to remove pollen form the indoor air you breathe.  Control of Mold  Allergen  Mold and fungi can grow on a variety of surfaces provided certain temperature and moisture conditions exist.  Outdoor molds grow on plants, decaying vegetation and soil.  The major outdoor mold, Alternaria dn Cladosporium, are found in very high numbers during hot and dry conditions.  Generally, a late Summer - Fall peak is seen for common outdoor fungal spores.  Rain will temporarily  lower outdoor mold spore count, but counts rise rapidly when the rainy period ends.  The most important indoor molds are Aspergillus and Penicillium.  Dark, humid and poorly ventilated basements are ideal sites for mold growth.  The next most common sites of mold growth are the bathroom and the kitchen.  Outdoor [employer] 1. Use air conditioning and keep windows closed 2. Avoid exposure to decaying vegetation. 3. Avoid leaf raking. 4. Avoid grain handling. 5. Consider wearing a face mask if working in moldy areas.  Indoor Mold Control 1. Maintain humidity below 50%. 2. Clean washable surfaces with 5% bleach solution. 3. Remove sources e.g. Contaminated carpets.  Control of Cockroach Allergen  Cockroach allergen has been identified as an important cause of acute attacks of asthma, especially in urban settings.  There are fifty-five species of cockroach that exist in the [place], however only three, the [place], [place] species produce allergen that can affect patients with Asthma.  Allergens can be obtained from fecal particles, egg casings and secretions from cockroaches.  1. Remove food sources. 2. Reduce access to water. 3. Seal access and entry points. 4. Spray runways with 0.5-1% Diazinon or Chlorpyrifos 5. Blow boric acid power under stoves and refrigerator. 6. Place bait stations (hydramethylnon) at feeding sites.

## 2014-11-16 NOTE — Progress Notes (Signed)
FOLLOW UP NOTE  RE: CARLON DAVIDSON MRN: [id number] DOB: [birth year] [facility] [place] [address],  [id number] Date of Office Visit: 11/16/2014  Subjective:  TAYELOR OSBORNE is a 64 y.o. female who presents today for Follow-up.   HPI: Marnita returns to the office in follow-up of allergic rhinoconjunctivitis and rare cough.  Since her last visit in the spring.  She notices intermittent though mild postnasal drip.  Currently, she is doing "pretty good, but had a rough spring".  She believes she did better taking the [place] for minimizing rhinorrhea, congestion, postnasal drip, and throat clearing cough.  She completed antibiotics with prednisone for sinus difficulties since her last visit but no repeated courses.  There is been no hospitalizations, emergency department or urgent care visits.  She reports a recent normal Nuclear stress test.  She wondered if there was any further additional or improved management besides [place].  She has been concerned with mild skin difficulties, having seen her primary care physician as well as dermatology mostly related to itching.  Unclear if there is a consistent rash or hives mostly her neck and chest and occasionally her arms which has been occurring in the recent weeks and she has seen [employer], [place]. She denies any acute fever, sore throat, GI or new respiratory symptoms associated with skin concerns.  We received record of a normal sinus CT scan from 2007, during a time when Haillee describes being symptomatic. She reports receiving an EpiPen years ago for a local reaction bee sting at right forearm which seemed to have secondary infection, but no generalized or systemic reaction history  And denies her recent sting.  Current medications: 1.  Singular 10 mg daily. 2.  Xyzal 5 mg daily. 3.  Patanase as needed. 4.  Pazeo one drop once daily as needed. 5.  Continues daily, Premarin,  multivitamin, vitamin E, fish oh, levothyroxine, and probiotic.  Drug Allergies: Allergies  Allergen Reactions  . Sulfa Antibiotics Rash    Objective:   Filed Vitals:   11/16/14 1134  BP: 118/76  Pulse: 76  Temp: 97.7 F (36.5 C)  Resp: 16   Physical Exam  Constitutional: She is well-developed, well-nourished, and in no distress.  HENT:  Head: Atraumatic.  Right Ear: Tympanic membrane and ear canal normal.  Left Ear: Tympanic membrane and ear canal normal.  Nose: Mucosal edema present. No rhinorrhea. No epistaxis.  Mouth/Throat: Oropharynx is clear and moist and mucous membranes are normal. No oropharyngeal exudate, posterior oropharyngeal edema or posterior oropharyngeal erythema.  Neck: Neck supple.  Cardiovascular: Normal rate, S1 normal and S2 normal.   No murmur heard. Pulmonary/Chest: Effort normal. She has no wheezes. She has no rhonchi. She has no rales.  Lymphadenopathy:    She has no cervical adenopathy.  Skin: Skin is warm and intact. No rash noted. No cyanosis. Nails show no clubbing.    Diagnostics: FVC 2.42--91%, FEV1 1.95--92%.  Assessment:   1. Allergic rhinoconjunctivitis   2. Cough   3. Itching, with clear skin today without dermatographism.   Plan:     Patient Instructions    1. Avoidance: Mite, Mold and Pollen   2. Antihistamine: Xyzal 5mg  by mouth once daily for runny nose or itching.   3. Nasal Spray: Patanase 1-2 spray(s) each nostril twice daily for stuffy nose or drainage.    4.  Continue Singulair 10mg  daily.   5. Eye Drops: Pazeo one drop(s) each  eye once daily for itchy eyes as needed.   6. Other: Review information on Oralair.      If new skin concerns take pictures and write down environment, exposure, ingestion and activity.      Moisturize regularly with fragrance free products --consider Vanicream.           Avoid fragranced soaps and detergents.  7. Nasal Saline wash each evening at bath/shower time daily and as  needed throughout the day.   8. Follow up Visit:  December 2016 for start of Sublingual tablet.    Finnian Husted M. Ishmael Holter, MD  cc: Rory Percy, MD

## 2014-12-15 ENCOUNTER — Other Ambulatory Visit: Payer: Medicare Other

## 2014-12-20 ENCOUNTER — Ambulatory Visit: Payer: Medicare Other | Admitting: Cardiology

## 2014-12-27 ENCOUNTER — Other Ambulatory Visit: Payer: Self-pay | Admitting: Gastroenterology

## 2014-12-27 NOTE — Telephone Encounter (Signed)
Please notify he patient I have sent in a refill but will need to be seen by June 1 (will be 2 years without OV at that point and no longer able to receive refills.)

## 2015-01-11 ENCOUNTER — Ambulatory Visit: Payer: Medicare Other | Admitting: Allergy and Immunology

## 2015-01-12 ENCOUNTER — Other Ambulatory Visit: Payer: Self-pay

## 2015-01-12 ENCOUNTER — Ambulatory Visit (INDEPENDENT_AMBULATORY_CARE_PROVIDER_SITE_OTHER): Payer: Medicare Other

## 2015-01-12 DIAGNOSIS — Q210 Ventricular septal defect: Secondary | ICD-10-CM

## 2015-01-12 DIAGNOSIS — R0602 Shortness of breath: Secondary | ICD-10-CM | POA: Diagnosis not present

## 2015-01-13 ENCOUNTER — Telehealth: Payer: Self-pay | Admitting: *Deleted

## 2015-01-13 NOTE — Telephone Encounter (Signed)
-----   Message from Satira Sark, MD sent at 01/13/2015  7:47 AM EST ----- Reviewed. Overall stable findings with normal LVEF, membranous VSD as before. No definite right ventricular strain with normal contraction and pulmonary artery systolic pressure only 37 mmHg.

## 2015-01-13 NOTE — Telephone Encounter (Signed)
Patient informed. 

## 2015-01-25 ENCOUNTER — Encounter: Payer: Self-pay | Admitting: Allergy and Immunology

## 2015-01-25 ENCOUNTER — Ambulatory Visit (INDEPENDENT_AMBULATORY_CARE_PROVIDER_SITE_OTHER): Payer: Medicare Other | Admitting: Allergy and Immunology

## 2015-01-25 VITALS — BP 110/68 | HR 58 | Temp 97.5°F | Resp 16

## 2015-01-25 DIAGNOSIS — R059 Cough, unspecified: Secondary | ICD-10-CM

## 2015-01-25 DIAGNOSIS — R05 Cough: Secondary | ICD-10-CM

## 2015-01-25 DIAGNOSIS — J309 Allergic rhinitis, unspecified: Secondary | ICD-10-CM

## 2015-01-25 DIAGNOSIS — H1010 Acute atopic conjunctivitis, unspecified eye: Secondary | ICD-10-CM | POA: Diagnosis not present

## 2015-01-25 MED ORDER — EPINEPHRINE 0.3 MG/0.3ML IJ SOAJ: 0.3000 mg | Freq: Once | INTRAMUSCULAR | Status: DC

## 2015-01-25 NOTE — Progress Notes (Signed)
FOLLOW UP NOTE  RE: ALLSION MCELVANY MRN: [id number] DOB: [birth year] [facility] [place] [address] [place], [place] [address] Date of Office Visit: 01/25/2015  Subjective:  JAKEYA STERKEL is a 64 y.o. female who presents today for start of Oralair.  Assessment:   1. Cough, asymptomatic.  2. Allergic rhinoconjunctivitis, with history of significant seasonal hypersenstivities including grasses.   Plan:   Meds ordered this encounter  Medications  . EPINEPHrine 0.3 mg/0.3 mL IJ SOAJ injection    Sig: Inject 0.3 mLs (0.3 mg total) into the muscle once.    Dispense:  2 Device    Refill:  1   Patient Instructions  1.  Solara will restart Singulair 10 mg daily consistently within the next several weeks, to initiate preventative regime prior to any pollen season. 2.  We will update by phone with tolerating Oralair, to continue once daily--and have reviewed in detail proper administration each time. 3.  Emergency action plan in place, EpiPen, Benadryl as needed. 4.  Continue Xyzal 5 mg daily. 5.  Pazeo and Patanase as needed. 6.  Continue skin regime and monitor closely for any recurring difficulties with regular moisturization 2-4 times daily. 7.  Follow-up in May, 2017-as as well as by phone with staff regarding pharmacy coverage of Oralair.  HPI: Sundari presents to the office for initiation of Oralair.  She has taken [place]  2 years ago without difficulty, but was interested in another option.  She is feeling well today without new recurring respiratory symptoms.  She maintains on Xyzal currently but not using Singulair during this winter season.  She reports completing antibiotics last month for "sinus issues" with 100% improvement.  She had thought her increased itching/rash was related to  swimming/chlorine and therefore has been more diligent at immediate showering after pool time and denies any recent rash and rarely any itching. She does not the addition of  secondary meds are beneficial. No new concerns today.  Denies ED or urgent care visits or prednisone courses. Reports sleep and activity are normal.  Current Medications: 1.  Xyzal 5mg  once daily. 2.  As needed Pazeo, Patanase, Singulair, Proair and Benadryl. 3.  Continues Calcium, Fish oil, Synthroid, Probiotic, Miralax. MVI, Xanax and Desonide.  Drug Allergies: Allergies  Allergen Reactions  . Sulfa Antibiotics Rash   Objective:   Filed Vitals:   01/25/15 1051 01/25/15 1209  BP: 104/74 110/68  Pulse: 60 58  Temp: 97.6 F (36.4 C) 97.5 F (36.4 C)  Resp: 12 16   SpO2 Readings from Last 1 Encounters:  01/25/15 97%   Physical Exam  Constitutional: She is well-developed, well-nourished, and in no distress.  HENT:  Head: Atraumatic.  Right Ear: Tympanic membrane and ear canal normal.  Left Ear: Tympanic membrane and ear canal normal.  Nose: Mucosal edema present. No rhinorrhea. No epistaxis.  Mouth/Throat: Oropharynx is clear and moist and mucous membranes are normal. No oropharyngeal exudate, posterior oropharyngeal edema or posterior oropharyngeal erythema.  Neck: Neck supple.  Cardiovascular: Normal rate, S1 normal and S2 normal.   No murmur heard. Pulmonary/Chest: Effort normal. She has no wheezes. She has no rhonchi. She has no rales.  Lymphadenopathy:    She has no cervical adenopathy.   Diagnostics: Spirometry:  FVC  2.35--88%, FEV1 1.94--101%. Butch Penny received Oralair 300IR in office without difficulty and repeat vitals as documented. Morganne had no complaints, questions or concerns after monitoring for one hour.    Rosealynn Mateus M. Ishmael Holter, MD  cc:  Rory Percy, MD

## 2015-01-26 ENCOUNTER — Encounter: Payer: Self-pay | Admitting: Cardiology

## 2015-01-26 ENCOUNTER — Ambulatory Visit (INDEPENDENT_AMBULATORY_CARE_PROVIDER_SITE_OTHER): Payer: Medicare Other | Admitting: Cardiology

## 2015-01-26 VITALS — BP 125/79 | HR 55 | Ht 62.0 in | Wt 123.8 lb

## 2015-01-26 DIAGNOSIS — I272 Other secondary pulmonary hypertension: Secondary | ICD-10-CM

## 2015-01-26 DIAGNOSIS — Q210 Ventricular septal defect: Secondary | ICD-10-CM | POA: Diagnosis not present

## 2015-01-26 NOTE — Patient Instructions (Signed)
Your physician wants you to follow-up in: 1 year with Dr. McDowell You will receive a reminder letter in the mail two months in advance. If you don't receive a letter, please call our office to schedule the follow-up appointment.  Your physician recommends that you continue on your current medications as directed. Please refer to the Current Medication list given to you today.  Thank you for choosing [facility]!!   

## 2015-01-26 NOTE — Progress Notes (Signed)
Cardiology Office Note  Date: 01/26/2015   ID: [name], DOB 05-12-1950, MRN [id number]  PCP: Rory Percy, MD  Primary Cardiologist: Rozann Lesches, MD   Chief Complaint  Patient presents with  . Ventricular Septal Defect    History of Present Illness: [name] is a 64 y.o. female last seen in September. She presents for a routine follow-up visit. Since we last met, she states that she has been doing very well. No recurrent chest pain or unusual shortness of breath. She is exercising 5 days a week, walks up to 3 miles at a time and also does water aerobics for an hour.  We discussed her recent follow-up echocardiogram, outlined below. Overall reassuring results with normal LVEF, no clear evidence of RV strain with known membranous VSD, and PASP 37 mmHg.  Past Medical History  Diagnosis Date  . Hypothyroidism   . Chronic constipation   . Ventricular septal defect (VSD), membranous     Documented by echocardiogram 2011  . Secondary pulmonary hypertension (HCC)     PASP 40 mmHg 2011  . Diverticulosis     Current Outpatient Prescriptions  Medication Sig Dispense Refill  . albuterol (PROVENTIL HFA;VENTOLIN HFA) 108 (90 BASE) MCG/ACT inhaler Inhale 2 puffs into the lungs every 6 (six) hours as needed for wheezing or shortness of breath. 1 Inhaler 1  . ALPRAZolam (XANAX) 0.25 MG tablet Take 0.25 mg by mouth at bedtime as needed. May take one tablet once every month or two    . Ascorbic Acid (VITAMIN C) 1000 MG tablet Take 1,000 mg by mouth daily.    . Calcium Citrate-Vitamin D (CALCIUM + D PO) Take 1,200 mg by mouth daily.    Marland Kitchen desonide (DESOWEN) 0.05 % cream Apply 1 application topically as needed. ONCE DAILY IF NEEDED  3  . diphenhydrAMINE (BENADRYL) 25 MG tablet Take 25 mg by mouth 2 (two) times daily as needed. Patient alternates with Xyzal    . EPINEPHrine (EPIPEN 2-PAK) 0.3 mg/0.3 mL IJ SOAJ injection Inject 0.3 mg into the muscle once. Reported on 01/25/2015     . EPINEPHrine 0.3 mg/0.3 mL IJ SOAJ injection Inject 0.3 mLs (0.3 mg total) into the muscle once. 2 Device 1  . fish oil-omega-3 fatty acids 1000 MG capsule Take 1 g by mouth daily.     Dub Amis Mix Pollens Allergen Ext (ORALAIR) 300 IR SUBL Place 1 tablet under the tongue daily. (SAMPLES NOW)    . levocetirizine (XYZAL) 5 MG tablet Take 1 tablet (5 mg total) by mouth daily. 30 tablet 5  . levothyroxine (SYNTHROID, LEVOTHROID) 75 MCG tablet Take 75 mcg by mouth daily.     . montelukast (SINGULAIR) 10 MG tablet Take 1 tablet (10 mg total) by mouth at bedtime. 30 tablet 5  . Multiple Vitamin (MULTIVITAMIN) capsule Take 1 capsule by mouth daily.    . NON FORMULARY Take 1 tablet by mouth daily. Tumeric    . Olopatadine HCl (PATANASE) 0.6 % SOLN Place 1 drop into the nose as needed. 1 Bottle 3  . Olopatadine HCl (PAZEO) 0.7 % SOLN Place 1 drop into both eyes 1 day or 1 dose. 1 Bottle 3  . polyethylene glycol powder (GLYCOLAX/MIRALAX) powder MIX AND TAKE ONE TO TWO CAPFULS NIGHTLY AS NEEDED. 527 g 2  . PREMARIN 0.625 MG tablet Take 0.625 mg by mouth daily.     . Probiotic Product (PROBIOTIC DAILY PO) Take by mouth.     No current facility-administered  medications for this visit.   Allergies:  Sulfa antibiotics   Social History: The patient  reports that she quit smoking about 46 years ago. Her smoking use included Cigarettes. She smoked 0.50 packs per day. She does not have any smokeless tobacco history on file. She reports that she drinks alcohol. She reports that she does not use illicit drugs.   ROS:  Please see the history of present illness. Otherwise, complete review of systems is positive for none.  All other systems are reviewed and negative.   Physical Exam: VS:  BP 125/79 mmHg  Pulse 55  Ht '5\' 2"'  (1.575 m)  Wt 123 lb 12.8 oz (56.155 kg)  BMI 22.64 kg/m2  SpO2 99%, BMI Body mass index is 22.64 kg/(m^2).  Wt Readings from Last 3 Encounters:  01/26/15 123 lb 12.8 oz (56.155 kg)    10/05/14 127 lb 12.8 oz (57.97 kg)  10/23/13 125 lb 8 oz (56.926 kg)    General: Patient appears comfortable at rest. HEENT: Conjunctiva and lids normal, oropharynx clear with moist mucosa. Neck: Supple, no elevated JVP or carotid bruits, no thyromegaly. Lungs: Clear to auscultation, nonlabored breathing at rest. Cardiac: Regular rate and rhythm, no S3, 3/6 systolic murmur at right base, no pericardial rub. Abdomen: Soft, nontender, no hepatomegaly, bowel sounds present, no guarding or rebound. Extremities: No pitting edema, distal pulses 2+.  ECG: Tracing from 08/19/2014 showed sinus bradycardia with nonspecific ST changes.  Other Studies Reviewed Today:  Exercise Cardiolite 09/06/2014:  Blood pressure demonstrated a hypertensive response to exercise.  T wave inversion was noted during stress in the V6, II, III, aVF and V5 leads, beginning at 6 minutes of stress, and returning to baseline after 5-9 mins of recovery.  This is a low risk study. Normal myocardial perfusion.  The left ventricular ejection fraction is hyperdynamic (>65%).  Nuclear stress EF: 73%.  Echocardiogram 01/12/2015: Study Conclusions  - Left ventricle: The cavity size was normal. Wall thickness was normal. Systolic function was normal. The estimated ejection fraction was in the range of 60% to 65%. Wall motion was normal; there were no regional wall motion abnormalities. Doppler parameters are consistent with abnormal left ventricular relaxation (grade 1 diastolic dysfunction). - Ventricular septum: Membranous VSD noted with left-to-right shunting. - Aortic valve: Trileaflet; mildly thickened leaflets. There was trivial regurgitation. - Mitral valve: Mildly calcified leaflets . There was mild to moderate regurgitation directed posteriorly. - Left atrium: The atrium was moderately dilated. - Right ventricle: Systolic function was normal. - Right atrium: Central venous pressure (est): 3 mm  Hg. - Tricuspid valve: There was mild regurgitation. - Pulmonary arteries: PA peak pressure: 37 mm Hg (S). - Pericardium, extracardiac: There was no pericardial effusion.  Impressions:  - Normal LV wall thickness with LVEF 60-65% and grade 1 diastolic dysfunction. Moderate left atrial enlargement. Mildly calcified mitral leaflets with mild to moderate, posteriorly directed mitral regurgitation. Mildly thickened aortic valve with trivial aortic regurgitation. Membranous VSD noted as before with left to right shunting. Visualization of the RVOT is not adequate to allow for calculation of Qp/Qs. Right ventricular chamber size and contraction are normal, arguing against a significant left-to-right shunt. Mild tricuspid regurgitation, PASP estimated at 37 mmHg.  Assessment and Plan:  1. Membranous VSD with left-to-right shunt, overall stable by recent echocardiogram. No clear evidence of progressive RV strain and PASP 37 mmHg.  2. No recurrent chest pain or increasing shortness of breath. She is maintaining a good exercise regimen and states that she feels  well. Ischemic testing from August of this year was low risk.  Current medicines were reviewed with the patient today.  Disposition: FU with me in 1 year.   Signed, Satira Sark, MD, [facility] 01/26/2015 1:58 PM    [facility] at [place], [place], [place] [address] Phone: [phone]; Fax: [phone]

## 2015-02-01 ENCOUNTER — Ambulatory Visit: Payer: Medicare Other | Admitting: Allergy and Immunology

## 2015-02-02 ENCOUNTER — Telehealth: Payer: Self-pay

## 2015-02-02 NOTE — Telephone Encounter (Signed)
Spoke with patient who said that she spoke with [facility] and they are having problems shipping her medication to her local pharmacy. Patient is also concerned about the price of Oralair a month. Patient said she would rather just not take it. Patient is asking to speak with Dr Ishmael Holter about this.

## 2015-02-02 NOTE — Telephone Encounter (Signed)
Patient is requesting to speak to Dr. Ishmael Holter about her medication that she will not be able to take. She stated she did not want to talk to a nurse, she wants to speak Directly to Dr. Ishmael Holter. I informed her that the nurse needed to gather information to tell Dr. Ishmael Holter.  Please Advise  Thanks

## 2015-02-02 NOTE — Telephone Encounter (Signed)
Called and left voicemail for patient to return phone call

## 2015-02-02 NOTE — Telephone Encounter (Signed)
Spoke with patient she is not interested in continuing the med because the speciality pharmacy is not able to complete their part of the process accurately.  The staff also reports that she is not interested in transferring to another pharmacy because the cost is too much.  Staff will make the company aware of  Incomplete work of the pharmacy.

## 2015-02-21 DIAGNOSIS — L245 Irritant contact dermatitis due to other chemical products: Secondary | ICD-10-CM | POA: Diagnosis not present

## 2015-02-21 DIAGNOSIS — Z2089 Contact with and (suspected) exposure to other communicable diseases: Secondary | ICD-10-CM | POA: Diagnosis not present

## 2015-02-25 ENCOUNTER — Other Ambulatory Visit: Payer: Self-pay

## 2015-02-25 DIAGNOSIS — Z1231 Encounter for screening mammogram for malignant neoplasm of breast: Secondary | ICD-10-CM

## 2015-03-01 DIAGNOSIS — M25512 Pain in left shoulder: Secondary | ICD-10-CM | POA: Diagnosis not present

## 2015-03-01 DIAGNOSIS — M7552 Bursitis of left shoulder: Secondary | ICD-10-CM | POA: Diagnosis not present

## 2015-03-03 DIAGNOSIS — L723 Sebaceous cyst: Secondary | ICD-10-CM | POA: Diagnosis not present

## 2015-03-03 DIAGNOSIS — Z79899 Other long term (current) drug therapy: Secondary | ICD-10-CM | POA: Diagnosis not present

## 2015-03-03 DIAGNOSIS — L562 Photocontact dermatitis [berloque dermatitis]: Secondary | ICD-10-CM | POA: Diagnosis not present

## 2015-03-03 DIAGNOSIS — Z2089 Contact with and (suspected) exposure to other communicable diseases: Secondary | ICD-10-CM | POA: Diagnosis not present

## 2015-03-30 ENCOUNTER — Other Ambulatory Visit: Payer: Self-pay | Admitting: Nurse Practitioner

## 2015-03-31 ENCOUNTER — Ambulatory Visit
Admission: RE | Admit: 2015-03-31 | Discharge: 2015-03-31 | Disposition: A | Discharge status: Home / Self Care | Payer: Medicare Other | Source: Ambulatory Visit

## 2015-03-31 DIAGNOSIS — Z1231 Encounter for screening mammogram for malignant neoplasm of breast: Secondary | ICD-10-CM

## 2015-04-01 ENCOUNTER — Other Ambulatory Visit: Payer: Self-pay

## 2015-04-04 DIAGNOSIS — M7552 Bursitis of left shoulder: Secondary | ICD-10-CM | POA: Diagnosis not present

## 2015-04-07 DIAGNOSIS — L723 Sebaceous cyst: Secondary | ICD-10-CM | POA: Diagnosis not present

## 2015-04-27 DIAGNOSIS — M25512 Pain in left shoulder: Secondary | ICD-10-CM | POA: Diagnosis not present

## 2015-04-27 DIAGNOSIS — M75102 Unspecified rotator cuff tear or rupture of left shoulder, not specified as traumatic: Secondary | ICD-10-CM | POA: Diagnosis not present

## 2015-05-05 DIAGNOSIS — M7552 Bursitis of left shoulder: Secondary | ICD-10-CM | POA: Diagnosis not present

## 2015-05-05 DIAGNOSIS — M25512 Pain in left shoulder: Secondary | ICD-10-CM | POA: Diagnosis not present

## 2015-05-05 DIAGNOSIS — M75112 Incomplete rotator cuff tear or rupture of left shoulder, not specified as traumatic: Secondary | ICD-10-CM | POA: Diagnosis not present

## 2015-05-06 ENCOUNTER — Other Ambulatory Visit: Payer: Self-pay | Admitting: Nurse Practitioner

## 2015-05-16 DIAGNOSIS — M75112 Incomplete rotator cuff tear or rupture of left shoulder, not specified as traumatic: Secondary | ICD-10-CM | POA: Diagnosis not present

## 2015-05-16 DIAGNOSIS — M7551 Bursitis of right shoulder: Secondary | ICD-10-CM | POA: Diagnosis not present

## 2015-05-20 DIAGNOSIS — Z79899 Other long term (current) drug therapy: Secondary | ICD-10-CM | POA: Diagnosis not present

## 2015-05-20 DIAGNOSIS — M75122 Complete rotator cuff tear or rupture of left shoulder, not specified as traumatic: Secondary | ICD-10-CM | POA: Diagnosis not present

## 2015-05-20 DIAGNOSIS — M75112 Incomplete rotator cuff tear or rupture of left shoulder, not specified as traumatic: Secondary | ICD-10-CM | POA: Diagnosis not present

## 2015-05-20 DIAGNOSIS — F329 Major depressive disorder, single episode, unspecified: Secondary | ICD-10-CM | POA: Diagnosis not present

## 2015-05-20 DIAGNOSIS — Z8249 Family history of ischemic heart disease and other diseases of the circulatory system: Secondary | ICD-10-CM | POA: Diagnosis not present

## 2015-05-20 DIAGNOSIS — G4700 Insomnia, unspecified: Secondary | ICD-10-CM | POA: Diagnosis not present

## 2015-05-20 DIAGNOSIS — M1990 Unspecified osteoarthritis, unspecified site: Secondary | ICD-10-CM | POA: Diagnosis not present

## 2015-05-20 DIAGNOSIS — E039 Hypothyroidism, unspecified: Secondary | ICD-10-CM | POA: Diagnosis not present

## 2015-05-20 DIAGNOSIS — F419 Anxiety disorder, unspecified: Secondary | ICD-10-CM | POA: Diagnosis not present

## 2015-05-20 DIAGNOSIS — Z87891 Personal history of nicotine dependence: Secondary | ICD-10-CM | POA: Diagnosis not present

## 2015-05-20 DIAGNOSIS — M7582 Other shoulder lesions, left shoulder: Secondary | ICD-10-CM | POA: Diagnosis not present

## 2015-05-20 DIAGNOSIS — Z882 Allergy status to sulfonamides status: Secondary | ICD-10-CM | POA: Diagnosis not present

## 2015-05-20 DIAGNOSIS — Z809 Family history of malignant neoplasm, unspecified: Secondary | ICD-10-CM | POA: Diagnosis not present

## 2015-05-20 DIAGNOSIS — M7542 Impingement syndrome of left shoulder: Secondary | ICD-10-CM | POA: Diagnosis not present

## 2015-05-20 DIAGNOSIS — M75102 Unspecified rotator cuff tear or rupture of left shoulder, not specified as traumatic: Secondary | ICD-10-CM | POA: Diagnosis not present

## 2015-05-20 DIAGNOSIS — Z79890 Hormone replacement therapy (postmenopausal): Secondary | ICD-10-CM | POA: Diagnosis not present

## 2015-05-20 DIAGNOSIS — M810 Age-related osteoporosis without current pathological fracture: Secondary | ICD-10-CM | POA: Diagnosis not present

## 2015-05-20 DIAGNOSIS — Z8489 Family history of other specified conditions: Secondary | ICD-10-CM | POA: Diagnosis not present

## 2015-05-20 DIAGNOSIS — G8918 Other acute postprocedural pain: Secondary | ICD-10-CM | POA: Diagnosis not present

## 2015-05-20 DIAGNOSIS — M7551 Bursitis of right shoulder: Secondary | ICD-10-CM | POA: Diagnosis not present

## 2015-05-30 DIAGNOSIS — S40012A Contusion of left shoulder, initial encounter: Secondary | ICD-10-CM | POA: Diagnosis not present

## 2015-06-10 DIAGNOSIS — G4709 Other insomnia: Secondary | ICD-10-CM | POA: Diagnosis not present

## 2015-06-10 DIAGNOSIS — G8918 Other acute postprocedural pain: Secondary | ICD-10-CM | POA: Diagnosis not present

## 2015-06-21 ENCOUNTER — Other Ambulatory Visit: Payer: Self-pay | Admitting: Allergy and Immunology

## 2015-06-30 DIAGNOSIS — M25512 Pain in left shoulder: Secondary | ICD-10-CM | POA: Diagnosis not present

## 2015-06-30 DIAGNOSIS — M75102 Unspecified rotator cuff tear or rupture of left shoulder, not specified as traumatic: Secondary | ICD-10-CM | POA: Diagnosis not present

## 2015-06-30 DIAGNOSIS — M6281 Muscle weakness (generalized): Secondary | ICD-10-CM | POA: Diagnosis not present

## 2015-07-01 DIAGNOSIS — M6281 Muscle weakness (generalized): Secondary | ICD-10-CM | POA: Diagnosis not present

## 2015-07-01 DIAGNOSIS — M75102 Unspecified rotator cuff tear or rupture of left shoulder, not specified as traumatic: Secondary | ICD-10-CM | POA: Diagnosis not present

## 2015-07-01 DIAGNOSIS — M25512 Pain in left shoulder: Secondary | ICD-10-CM | POA: Diagnosis not present

## 2015-07-04 DIAGNOSIS — M6281 Muscle weakness (generalized): Secondary | ICD-10-CM | POA: Diagnosis not present

## 2015-07-04 DIAGNOSIS — M25512 Pain in left shoulder: Secondary | ICD-10-CM | POA: Diagnosis not present

## 2015-07-04 DIAGNOSIS — M75102 Unspecified rotator cuff tear or rupture of left shoulder, not specified as traumatic: Secondary | ICD-10-CM | POA: Diagnosis not present

## 2015-07-06 DIAGNOSIS — M75102 Unspecified rotator cuff tear or rupture of left shoulder, not specified as traumatic: Secondary | ICD-10-CM | POA: Diagnosis not present

## 2015-07-06 DIAGNOSIS — M6281 Muscle weakness (generalized): Secondary | ICD-10-CM | POA: Diagnosis not present

## 2015-07-06 DIAGNOSIS — M25512 Pain in left shoulder: Secondary | ICD-10-CM | POA: Diagnosis not present

## 2015-07-08 DIAGNOSIS — M75102 Unspecified rotator cuff tear or rupture of left shoulder, not specified as traumatic: Secondary | ICD-10-CM | POA: Diagnosis not present

## 2015-07-08 DIAGNOSIS — M25512 Pain in left shoulder: Secondary | ICD-10-CM | POA: Diagnosis not present

## 2015-07-08 DIAGNOSIS — M6281 Muscle weakness (generalized): Secondary | ICD-10-CM | POA: Diagnosis not present

## 2015-07-11 DIAGNOSIS — M25512 Pain in left shoulder: Secondary | ICD-10-CM | POA: Diagnosis not present

## 2015-07-11 DIAGNOSIS — M6281 Muscle weakness (generalized): Secondary | ICD-10-CM | POA: Diagnosis not present

## 2015-07-11 DIAGNOSIS — M75102 Unspecified rotator cuff tear or rupture of left shoulder, not specified as traumatic: Secondary | ICD-10-CM | POA: Diagnosis not present

## 2015-07-13 ENCOUNTER — Telehealth: Payer: Self-pay | Admitting: Internal Medicine

## 2015-07-13 DIAGNOSIS — M6281 Muscle weakness (generalized): Secondary | ICD-10-CM | POA: Diagnosis not present

## 2015-07-13 DIAGNOSIS — M25512 Pain in left shoulder: Secondary | ICD-10-CM | POA: Diagnosis not present

## 2015-07-13 DIAGNOSIS — M75102 Unspecified rotator cuff tear or rupture of left shoulder, not specified as traumatic: Secondary | ICD-10-CM | POA: Diagnosis not present

## 2015-07-13 MED ORDER — POLYETHYLENE GLYCOL 3350 17 GM/SCOOP PO POWD
ORAL | Status: DC
Start: 2015-07-13 — End: 2016-01-25

## 2015-07-13 NOTE — Addendum Note (Signed)
Addended by: Orvil Feil on: 07/13/2015 10:30 AM   Modules accepted: Orders

## 2015-07-13 NOTE — Telephone Encounter (Signed)
Routing to the refill box. 

## 2015-07-13 NOTE — Telephone Encounter (Signed)
Pt called to say that she was almost out of her miralax and needed it called into [facility] in [place]. [phone]

## 2015-07-13 NOTE — Telephone Encounter (Signed)
Completed.

## 2015-07-15 DIAGNOSIS — M25512 Pain in left shoulder: Secondary | ICD-10-CM | POA: Diagnosis not present

## 2015-07-15 DIAGNOSIS — M6281 Muscle weakness (generalized): Secondary | ICD-10-CM | POA: Diagnosis not present

## 2015-07-15 DIAGNOSIS — M75102 Unspecified rotator cuff tear or rupture of left shoulder, not specified as traumatic: Secondary | ICD-10-CM | POA: Diagnosis not present

## 2015-07-17 ENCOUNTER — Other Ambulatory Visit: Payer: Self-pay | Admitting: Allergy and Immunology

## 2015-07-18 DIAGNOSIS — M75102 Unspecified rotator cuff tear or rupture of left shoulder, not specified as traumatic: Secondary | ICD-10-CM | POA: Diagnosis not present

## 2015-07-18 DIAGNOSIS — M6281 Muscle weakness (generalized): Secondary | ICD-10-CM | POA: Diagnosis not present

## 2015-07-18 DIAGNOSIS — M25512 Pain in left shoulder: Secondary | ICD-10-CM | POA: Diagnosis not present

## 2015-07-29 DIAGNOSIS — M75102 Unspecified rotator cuff tear or rupture of left shoulder, not specified as traumatic: Secondary | ICD-10-CM | POA: Diagnosis not present

## 2015-07-29 DIAGNOSIS — M6281 Muscle weakness (generalized): Secondary | ICD-10-CM | POA: Diagnosis not present

## 2015-07-29 DIAGNOSIS — M25512 Pain in left shoulder: Secondary | ICD-10-CM | POA: Diagnosis not present

## 2015-07-30 ENCOUNTER — Other Ambulatory Visit: Payer: Self-pay | Admitting: Allergy and Immunology

## 2015-08-01 DIAGNOSIS — M75102 Unspecified rotator cuff tear or rupture of left shoulder, not specified as traumatic: Secondary | ICD-10-CM | POA: Diagnosis not present

## 2015-08-01 DIAGNOSIS — M25512 Pain in left shoulder: Secondary | ICD-10-CM | POA: Diagnosis not present

## 2015-08-01 DIAGNOSIS — M6281 Muscle weakness (generalized): Secondary | ICD-10-CM | POA: Diagnosis not present

## 2015-08-03 DIAGNOSIS — M6281 Muscle weakness (generalized): Secondary | ICD-10-CM | POA: Diagnosis not present

## 2015-08-03 DIAGNOSIS — M75102 Unspecified rotator cuff tear or rupture of left shoulder, not specified as traumatic: Secondary | ICD-10-CM | POA: Diagnosis not present

## 2015-08-03 DIAGNOSIS — M25512 Pain in left shoulder: Secondary | ICD-10-CM | POA: Diagnosis not present

## 2015-08-04 DIAGNOSIS — M75102 Unspecified rotator cuff tear or rupture of left shoulder, not specified as traumatic: Secondary | ICD-10-CM | POA: Diagnosis not present

## 2015-08-04 DIAGNOSIS — M25512 Pain in left shoulder: Secondary | ICD-10-CM | POA: Diagnosis not present

## 2015-08-04 DIAGNOSIS — M6281 Muscle weakness (generalized): Secondary | ICD-10-CM | POA: Diagnosis not present

## 2015-08-08 DIAGNOSIS — M25512 Pain in left shoulder: Secondary | ICD-10-CM | POA: Diagnosis not present

## 2015-08-08 DIAGNOSIS — M6281 Muscle weakness (generalized): Secondary | ICD-10-CM | POA: Diagnosis not present

## 2015-08-08 DIAGNOSIS — M75102 Unspecified rotator cuff tear or rupture of left shoulder, not specified as traumatic: Secondary | ICD-10-CM | POA: Diagnosis not present

## 2015-08-10 DIAGNOSIS — M6281 Muscle weakness (generalized): Secondary | ICD-10-CM | POA: Diagnosis not present

## 2015-08-10 DIAGNOSIS — M75102 Unspecified rotator cuff tear or rupture of left shoulder, not specified as traumatic: Secondary | ICD-10-CM | POA: Diagnosis not present

## 2015-08-10 DIAGNOSIS — M25512 Pain in left shoulder: Secondary | ICD-10-CM | POA: Diagnosis not present

## 2015-08-15 DIAGNOSIS — M25512 Pain in left shoulder: Secondary | ICD-10-CM | POA: Diagnosis not present

## 2015-08-15 DIAGNOSIS — M75102 Unspecified rotator cuff tear or rupture of left shoulder, not specified as traumatic: Secondary | ICD-10-CM | POA: Diagnosis not present

## 2015-08-15 DIAGNOSIS — M6281 Muscle weakness (generalized): Secondary | ICD-10-CM | POA: Diagnosis not present

## 2015-08-17 DIAGNOSIS — M6281 Muscle weakness (generalized): Secondary | ICD-10-CM | POA: Diagnosis not present

## 2015-08-17 DIAGNOSIS — M75102 Unspecified rotator cuff tear or rupture of left shoulder, not specified as traumatic: Secondary | ICD-10-CM | POA: Diagnosis not present

## 2015-08-17 DIAGNOSIS — M25512 Pain in left shoulder: Secondary | ICD-10-CM | POA: Diagnosis not present

## 2015-08-19 DIAGNOSIS — M25512 Pain in left shoulder: Secondary | ICD-10-CM | POA: Diagnosis not present

## 2015-08-19 DIAGNOSIS — M75102 Unspecified rotator cuff tear or rupture of left shoulder, not specified as traumatic: Secondary | ICD-10-CM | POA: Diagnosis not present

## 2015-08-19 DIAGNOSIS — M6281 Muscle weakness (generalized): Secondary | ICD-10-CM | POA: Diagnosis not present

## 2015-08-21 DIAGNOSIS — L500 Allergic urticaria: Secondary | ICD-10-CM | POA: Diagnosis not present

## 2015-08-21 DIAGNOSIS — T7840XA Allergy, unspecified, initial encounter: Secondary | ICD-10-CM | POA: Diagnosis not present

## 2015-08-23 DIAGNOSIS — H40003 Preglaucoma, unspecified, bilateral: Secondary | ICD-10-CM | POA: Diagnosis not present

## 2015-08-23 DIAGNOSIS — H2513 Age-related nuclear cataract, bilateral: Secondary | ICD-10-CM | POA: Diagnosis not present

## 2015-08-31 ENCOUNTER — Other Ambulatory Visit: Payer: Self-pay | Admitting: Allergy and Immunology

## 2015-09-04 DIAGNOSIS — T7840XA Allergy, unspecified, initial encounter: Secondary | ICD-10-CM | POA: Diagnosis not present

## 2015-09-04 DIAGNOSIS — S40861A Insect bite (nonvenomous) of right upper arm, initial encounter: Secondary | ICD-10-CM | POA: Diagnosis not present

## 2015-09-30 ENCOUNTER — Other Ambulatory Visit: Payer: Self-pay

## 2015-10-04 ENCOUNTER — Other Ambulatory Visit: Payer: Self-pay | Admitting: *Deleted

## 2015-10-06 ENCOUNTER — Other Ambulatory Visit: Payer: Self-pay | Admitting: *Deleted

## 2015-10-06 NOTE — Telephone Encounter (Signed)
Spoke to pharmacy advised 3rd denial needs office visit.

## 2015-10-11 DIAGNOSIS — S134XXA Sprain of ligaments of cervical spine, initial encounter: Secondary | ICD-10-CM | POA: Diagnosis not present

## 2015-10-11 DIAGNOSIS — M9903 Segmental and somatic dysfunction of lumbar region: Secondary | ICD-10-CM | POA: Diagnosis not present

## 2015-10-11 DIAGNOSIS — M546 Pain in thoracic spine: Secondary | ICD-10-CM | POA: Diagnosis not present

## 2015-10-11 DIAGNOSIS — M9902 Segmental and somatic dysfunction of thoracic region: Secondary | ICD-10-CM | POA: Diagnosis not present

## 2015-10-11 DIAGNOSIS — S335XXA Sprain of ligaments of lumbar spine, initial encounter: Secondary | ICD-10-CM | POA: Diagnosis not present

## 2015-10-11 DIAGNOSIS — M4004 Postural kyphosis, thoracic region: Secondary | ICD-10-CM | POA: Diagnosis not present

## 2015-10-11 DIAGNOSIS — M531 Cervicobrachial syndrome: Secondary | ICD-10-CM | POA: Diagnosis not present

## 2015-10-11 DIAGNOSIS — M9901 Segmental and somatic dysfunction of cervical region: Secondary | ICD-10-CM | POA: Diagnosis not present

## 2015-10-11 DIAGNOSIS — M47812 Spondylosis without myelopathy or radiculopathy, cervical region: Secondary | ICD-10-CM | POA: Diagnosis not present

## 2015-10-18 DIAGNOSIS — M531 Cervicobrachial syndrome: Secondary | ICD-10-CM | POA: Diagnosis not present

## 2015-10-18 DIAGNOSIS — M9901 Segmental and somatic dysfunction of cervical region: Secondary | ICD-10-CM | POA: Diagnosis not present

## 2015-10-18 DIAGNOSIS — M9902 Segmental and somatic dysfunction of thoracic region: Secondary | ICD-10-CM | POA: Diagnosis not present

## 2015-10-18 DIAGNOSIS — S335XXA Sprain of ligaments of lumbar spine, initial encounter: Secondary | ICD-10-CM | POA: Diagnosis not present

## 2015-10-18 DIAGNOSIS — M4004 Postural kyphosis, thoracic region: Secondary | ICD-10-CM | POA: Diagnosis not present

## 2015-10-18 DIAGNOSIS — M546 Pain in thoracic spine: Secondary | ICD-10-CM | POA: Diagnosis not present

## 2015-10-18 DIAGNOSIS — M9903 Segmental and somatic dysfunction of lumbar region: Secondary | ICD-10-CM | POA: Diagnosis not present

## 2015-10-18 DIAGNOSIS — S134XXA Sprain of ligaments of cervical spine, initial encounter: Secondary | ICD-10-CM | POA: Diagnosis not present

## 2015-10-18 DIAGNOSIS — M47812 Spondylosis without myelopathy or radiculopathy, cervical region: Secondary | ICD-10-CM | POA: Diagnosis not present

## 2015-10-20 DIAGNOSIS — D225 Melanocytic nevi of trunk: Secondary | ICD-10-CM | POA: Diagnosis not present

## 2015-10-20 DIAGNOSIS — L308 Other specified dermatitis: Secondary | ICD-10-CM | POA: Diagnosis not present

## 2015-10-20 DIAGNOSIS — Z1283 Encounter for screening for malignant neoplasm of skin: Secondary | ICD-10-CM | POA: Diagnosis not present

## 2015-10-20 DIAGNOSIS — L908 Other atrophic disorders of skin: Secondary | ICD-10-CM | POA: Diagnosis not present

## 2015-11-01 DIAGNOSIS — M9902 Segmental and somatic dysfunction of thoracic region: Secondary | ICD-10-CM | POA: Diagnosis not present

## 2015-11-01 DIAGNOSIS — M4004 Postural kyphosis, thoracic region: Secondary | ICD-10-CM | POA: Diagnosis not present

## 2015-11-01 DIAGNOSIS — M9903 Segmental and somatic dysfunction of lumbar region: Secondary | ICD-10-CM | POA: Diagnosis not present

## 2015-11-01 DIAGNOSIS — M9901 Segmental and somatic dysfunction of cervical region: Secondary | ICD-10-CM | POA: Diagnosis not present

## 2015-11-01 DIAGNOSIS — M531 Cervicobrachial syndrome: Secondary | ICD-10-CM | POA: Diagnosis not present

## 2015-11-01 DIAGNOSIS — M546 Pain in thoracic spine: Secondary | ICD-10-CM | POA: Diagnosis not present

## 2015-11-01 DIAGNOSIS — S134XXA Sprain of ligaments of cervical spine, initial encounter: Secondary | ICD-10-CM | POA: Diagnosis not present

## 2015-11-01 DIAGNOSIS — S335XXA Sprain of ligaments of lumbar spine, initial encounter: Secondary | ICD-10-CM | POA: Diagnosis not present

## 2015-11-01 DIAGNOSIS — M47812 Spondylosis without myelopathy or radiculopathy, cervical region: Secondary | ICD-10-CM | POA: Diagnosis not present

## 2015-11-03 DIAGNOSIS — H01021 Squamous blepharitis right upper eyelid: Secondary | ICD-10-CM | POA: Diagnosis not present

## 2015-11-03 DIAGNOSIS — H01022 Squamous blepharitis right lower eyelid: Secondary | ICD-10-CM | POA: Diagnosis not present

## 2015-11-30 DIAGNOSIS — M4004 Postural kyphosis, thoracic region: Secondary | ICD-10-CM | POA: Diagnosis not present

## 2015-11-30 DIAGNOSIS — M47812 Spondylosis without myelopathy or radiculopathy, cervical region: Secondary | ICD-10-CM | POA: Diagnosis not present

## 2015-11-30 DIAGNOSIS — M9901 Segmental and somatic dysfunction of cervical region: Secondary | ICD-10-CM | POA: Diagnosis not present

## 2015-11-30 DIAGNOSIS — M546 Pain in thoracic spine: Secondary | ICD-10-CM | POA: Diagnosis not present

## 2015-11-30 DIAGNOSIS — M9903 Segmental and somatic dysfunction of lumbar region: Secondary | ICD-10-CM | POA: Diagnosis not present

## 2015-11-30 DIAGNOSIS — M9902 Segmental and somatic dysfunction of thoracic region: Secondary | ICD-10-CM | POA: Diagnosis not present

## 2015-11-30 DIAGNOSIS — S134XXA Sprain of ligaments of cervical spine, initial encounter: Secondary | ICD-10-CM | POA: Diagnosis not present

## 2015-11-30 DIAGNOSIS — M531 Cervicobrachial syndrome: Secondary | ICD-10-CM | POA: Diagnosis not present

## 2015-11-30 DIAGNOSIS — S335XXA Sprain of ligaments of lumbar spine, initial encounter: Secondary | ICD-10-CM | POA: Diagnosis not present

## 2015-12-06 DIAGNOSIS — M810 Age-related osteoporosis without current pathological fracture: Secondary | ICD-10-CM | POA: Diagnosis not present

## 2015-12-06 DIAGNOSIS — E7800 Pure hypercholesterolemia, unspecified: Secondary | ICD-10-CM | POA: Diagnosis not present

## 2015-12-06 DIAGNOSIS — Z79899 Other long term (current) drug therapy: Secondary | ICD-10-CM | POA: Diagnosis not present

## 2015-12-06 DIAGNOSIS — F418 Other specified anxiety disorders: Secondary | ICD-10-CM | POA: Diagnosis not present

## 2015-12-06 DIAGNOSIS — E038 Other specified hypothyroidism: Secondary | ICD-10-CM | POA: Diagnosis not present

## 2015-12-06 DIAGNOSIS — G4709 Other insomnia: Secondary | ICD-10-CM | POA: Diagnosis not present

## 2015-12-06 DIAGNOSIS — I350 Nonrheumatic aortic (valve) stenosis: Secondary | ICD-10-CM | POA: Diagnosis not present

## 2015-12-09 DIAGNOSIS — M810 Age-related osteoporosis without current pathological fracture: Secondary | ICD-10-CM | POA: Diagnosis not present

## 2015-12-09 DIAGNOSIS — Z6823 Body mass index (BMI) 23.0-23.9, adult: Secondary | ICD-10-CM | POA: Diagnosis not present

## 2015-12-09 DIAGNOSIS — F418 Other specified anxiety disorders: Secondary | ICD-10-CM | POA: Diagnosis not present

## 2015-12-09 DIAGNOSIS — Z0001 Encounter for general adult medical examination with abnormal findings: Secondary | ICD-10-CM | POA: Diagnosis not present

## 2015-12-09 DIAGNOSIS — J0100 Acute maxillary sinusitis, unspecified: Secondary | ICD-10-CM | POA: Diagnosis not present

## 2015-12-09 DIAGNOSIS — Z1389 Encounter for screening for other disorder: Secondary | ICD-10-CM | POA: Diagnosis not present

## 2015-12-09 DIAGNOSIS — Q210 Ventricular septal defect: Secondary | ICD-10-CM | POA: Diagnosis not present

## 2015-12-09 DIAGNOSIS — E038 Other specified hypothyroidism: Secondary | ICD-10-CM | POA: Diagnosis not present

## 2016-01-13 DIAGNOSIS — Z6823 Body mass index (BMI) 23.0-23.9, adult: Secondary | ICD-10-CM | POA: Diagnosis not present

## 2016-01-13 DIAGNOSIS — R300 Dysuria: Secondary | ICD-10-CM | POA: Diagnosis not present

## 2016-01-25 ENCOUNTER — Encounter: Payer: Self-pay | Admitting: Cardiology

## 2016-01-25 ENCOUNTER — Ambulatory Visit (INDEPENDENT_AMBULATORY_CARE_PROVIDER_SITE_OTHER): Payer: Medicare Other | Admitting: Cardiology

## 2016-01-25 VITALS — BP 138/76 | HR 51 | Ht 62.0 in | Wt 129.0 lb

## 2016-01-25 DIAGNOSIS — Q210 Ventricular septal defect: Secondary | ICD-10-CM

## 2016-01-25 DIAGNOSIS — I272 Pulmonary hypertension, unspecified: Secondary | ICD-10-CM

## 2016-01-25 NOTE — Progress Notes (Signed)
Cardiology Office Note  Date: 01/25/2016   ID: TASHALA KLOCKO, DOB 1950-11-17, MRN [id number]  PCP: Rory Percy, MD  Primary Cardiologist: Rozann Lesches, MD   Chief Complaint  Patient presents with  . VSD    History of Present Illness: [name] is a 65 y.o. female last seen in December 2016. She presents for a routine follow-up visit today. Reports doing well since last encounter. She does water aerobics 5 days a week, also walks up to 3 miles most days of the week. She does not report any significant loss in stamina, generally NYHA class II dyspnea. No exertional chest pain or palpitations.  I reviewed her ECG today which shows sinus bradycardia. She has had no dizziness or syncope.  Echocardiogram from December 2016 is outlined below.  Past Medical History:  Diagnosis Date  . Chronic constipation   . Diverticulosis   . Hypothyroidism   . 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 Ventricular septal defect (VSD), membranous    Documented by echocardiogram 2011    Current Outpatient Prescriptions  Medication Sig Dispense Refill  . ALPRAZolam (XANAX) 0.25 MG tablet Take 0.25 mg by mouth at bedtime as needed. May take one tablet once every month or two    . Ascorbic Acid (VITAMIN C) 1000 MG tablet Take 1,000 mg by mouth daily.    . Calcium Citrate-Vitamin D (CALCIUM + D PO) Take 1,200 mg by mouth daily.    Marland Kitchen desonide (DESOWEN) 0.05 % cream Apply 1 application topically as needed. ONCE DAILY IF NEEDED  3  . diphenhydrAMINE (BENADRYL) 25 MG tablet Take 25 mg by mouth 2 (two) times daily as needed. Patient alternates with Xyzal    . EPINEPHrine (EPIPEN 2-PAK) 0.3 mg/0.3 mL IJ SOAJ injection Inject 0.3 mg into the muscle once. Reported on 01/25/2015    . fish oil-omega-3 fatty acids 1000 MG capsule Take 1 g by mouth daily.     Marland Kitchen levocetirizine (XYZAL) 5 MG tablet TAKE 1 TABLET (5 MG TOTAL) BY MOUTH DAILY. 30 tablet 1  . levothyroxine (SYNTHROID,  LEVOTHROID) 75 MCG tablet Take 75 mcg by mouth daily.     . montelukast (SINGULAIR) 10 MG tablet TAKE 1 TABLET (10 MG TOTAL) BY MOUTH AT BEDTIME. 30 tablet 0  . Multiple Vitamin (MULTIVITAMIN) capsule Take 1 capsule by mouth daily.    . NON FORMULARY Take 1 tablet by mouth daily. Tumeric    . Olopatadine HCl (PATANASE) 0.6 % SOLN Place 1 drop into the nose as needed. 1 Bottle 3  . polyethylene glycol powder (GLYCOLAX/MIRALAX) powder MIX AND TAKE ONE TO TWO CAPFULS NIGHTLY AS NEEDED. 527 g 2  . Potassium 95 MG TABS Take 1 tablet by mouth daily as needed.    Marland Kitchen PREMARIN 0.625 MG tablet Take 0.625 mg by mouth daily.     . Probiotic Product (PROBIOTIC DAILY PO) Take by mouth.     No current facility-administered medications for this visit.    Allergies:  Sulfa antibiotics   Social History: The patient  reports that she quit smoking about 47 years ago. Her smoking use included Cigarettes. She started smoking about 52 years ago. She has a 2.50 pack-year smoking history. She has never used smokeless tobacco. She reports that she drinks alcohol. She reports that she does not use drugs.   ROS:  Please see the history of present illness. Otherwise, complete review of systems is positive for none.  All other  systems are reviewed and negative.   Physical Exam: VS:  BP 138/76   Pulse (!) 51   Ht 5\' 2"  (1.575 m)   Wt 129 lb (58.5 kg)   BMI 23.59 kg/m , BMI Body mass index is 23.59 kg/m.  Wt Readings from Last 3 Encounters:  01/25/16 129 lb (58.5 kg)  01/26/15 123 lb 12.8 oz (56.2 kg)  10/05/14 127 lb 12.8 oz (58 kg)    General: Patient appears comfortable at rest. HEENT: Conjunctiva and lids normal, oropharynx clear with moist mucosa. Neck: Supple, no elevated JVP or carotid bruits, no thyromegaly. Lungs: Clear to auscultation, nonlabored breathing at rest. Cardiac: Regular rate and rhythm, no S3, 3/6 systolic murmur at right base, no pericardial rub. Abdomen: Soft, nontender, no hepatomegaly,  bowel sounds present, no guarding or rebound. Extremities: No pitting edema, distal pulses 2+.  ECG: I personally reviewed the tracing from 08/19/2014 which showed sinus bradycardia.  Other Studies Reviewed Today:  Exercise Cardiolite 09/06/2014:  Blood pressure demonstrated a hypertensive response to exercise.  T wave inversion was noted during stress in the V6, II, III, aVF and V5 leads, beginning at 6 minutes of stress, and returning to baseline after 5-9 mins of recovery.  This is a low risk study. Normal myocardial perfusion.  The left ventricular ejection fraction is hyperdynamic (>65%).  Nuclear stress EF: 73%.  Echocardiogram 01/12/2015: Study Conclusions  - Left ventricle: The cavity size was normal. Wall thickness was normal. Systolic function was normal. The estimated ejection fraction was in the range of 60% to 65%. Wall motion was normal; there were no regional wall motion abnormalities. Doppler parameters are consistent with abnormal left ventricular relaxation (grade 1 diastolic dysfunction). - Ventricular septum: Membranous VSD noted with left-to-right shunting. - Aortic valve: Trileaflet; mildly thickened leaflets. There was trivial regurgitation. - Mitral valve: Mildly calcified leaflets . There was mild to moderate regurgitation directed posteriorly. - Left atrium: The atrium was moderately dilated. - Right ventricle: Systolic function was normal. - Right atrium: Central venous pressure (est): 3 mm Hg. - Tricuspid valve: There was mild regurgitation. - Pulmonary arteries: PA peak pressure: 37 mm Hg (S). - Pericardium, extracardiac: There was no pericardial effusion.  Impressions:  - Normal LV wall thickness with LVEF 60-65% and grade 1 diastolic dysfunction. Moderate left atrial enlargement. Mildly calcified mitral leaflets with mild to moderate, posteriorly directed mitral regurgitation. Mildly thickened aortic valve with  trivial aortic regurgitation. Membranous VSD noted as before with left to right shunting. Visualization of the RVOT is not adequate to allow for calculation of Qp/Qs. Right ventricular chamber size and contraction are normal, arguing against a significant left-to-right shunt. Mild tricuspid regurgitation, PASP estimated at 37 mmHg.  Assessment and Plan:  1. Membranous VSD with left-to-right shunt by history, most recent echocardiogram from last year outlined above. She reports no change in symptoms, murmur is also the same. Continue with observation.  2. History of pulmonary hypertension, PASP was 37 mmHg by her last study without significant right ventricular enlargement.  Current medicines were reviewed with the patient today.   Orders Placed This Encounter  Procedures  . EKG 12-Lead    Disposition: Follow-up in one year.  Signed, Satira Sark, MD, [facility] 01/25/2016 1:24 PM    [facility] at [place], [place], [place] [address] Phone: [phone]; Fax: [phone]

## 2016-01-25 NOTE — Patient Instructions (Signed)

## 2016-02-13 DIAGNOSIS — M9903 Segmental and somatic dysfunction of lumbar region: Secondary | ICD-10-CM | POA: Diagnosis not present

## 2016-02-13 DIAGNOSIS — S338XXA Sprain of other parts of lumbar spine and pelvis, initial encounter: Secondary | ICD-10-CM | POA: Diagnosis not present

## 2016-02-28 ENCOUNTER — Other Ambulatory Visit: Payer: Self-pay | Admitting: Family Medicine

## 2016-02-28 DIAGNOSIS — Z1231 Encounter for screening mammogram for malignant neoplasm of breast: Secondary | ICD-10-CM

## 2016-03-01 DIAGNOSIS — R05 Cough: Secondary | ICD-10-CM | POA: Diagnosis not present

## 2016-03-01 DIAGNOSIS — Z6823 Body mass index (BMI) 23.0-23.9, adult: Secondary | ICD-10-CM | POA: Diagnosis not present

## 2016-03-01 DIAGNOSIS — J0100 Acute maxillary sinusitis, unspecified: Secondary | ICD-10-CM | POA: Diagnosis not present

## 2016-03-10 ENCOUNTER — Other Ambulatory Visit: Payer: Self-pay | Admitting: Gastroenterology

## 2016-03-12 DIAGNOSIS — M9903 Segmental and somatic dysfunction of lumbar region: Secondary | ICD-10-CM | POA: Diagnosis not present

## 2016-03-12 DIAGNOSIS — S338XXA Sprain of other parts of lumbar spine and pelvis, initial encounter: Secondary | ICD-10-CM | POA: Diagnosis not present

## 2016-04-02 ENCOUNTER — Ambulatory Visit
Admission: RE | Admit: 2016-04-02 | Discharge: 2016-04-02 | Disposition: A | Discharge status: Home / Self Care | Payer: Medicare Other | Source: Ambulatory Visit | Attending: Family Medicine | Admitting: Family Medicine

## 2016-04-02 DIAGNOSIS — Z1231 Encounter for screening mammogram for malignant neoplasm of breast: Secondary | ICD-10-CM | POA: Diagnosis not present

## 2016-04-05 DIAGNOSIS — M9903 Segmental and somatic dysfunction of lumbar region: Secondary | ICD-10-CM | POA: Diagnosis not present

## 2016-04-05 DIAGNOSIS — S338XXA Sprain of other parts of lumbar spine and pelvis, initial encounter: Secondary | ICD-10-CM | POA: Diagnosis not present

## 2016-04-14 DIAGNOSIS — Z6824 Body mass index (BMI) 24.0-24.9, adult: Secondary | ICD-10-CM | POA: Diagnosis not present

## 2016-04-14 DIAGNOSIS — J111 Influenza due to unidentified influenza virus with other respiratory manifestations: Secondary | ICD-10-CM | POA: Diagnosis not present

## 2016-05-01 DIAGNOSIS — S338XXA Sprain of other parts of lumbar spine and pelvis, initial encounter: Secondary | ICD-10-CM | POA: Diagnosis not present

## 2016-05-01 DIAGNOSIS — M9903 Segmental and somatic dysfunction of lumbar region: Secondary | ICD-10-CM | POA: Diagnosis not present

## 2016-06-26 DIAGNOSIS — M9903 Segmental and somatic dysfunction of lumbar region: Secondary | ICD-10-CM | POA: Diagnosis not present

## 2016-06-26 DIAGNOSIS — S338XXA Sprain of other parts of lumbar spine and pelvis, initial encounter: Secondary | ICD-10-CM | POA: Diagnosis not present

## 2016-06-27 DIAGNOSIS — M9903 Segmental and somatic dysfunction of lumbar region: Secondary | ICD-10-CM | POA: Diagnosis not present

## 2016-06-27 DIAGNOSIS — S338XXA Sprain of other parts of lumbar spine and pelvis, initial encounter: Secondary | ICD-10-CM | POA: Diagnosis not present

## 2016-06-28 DIAGNOSIS — L820 Inflamed seborrheic keratosis: Secondary | ICD-10-CM | POA: Diagnosis not present

## 2016-06-28 DIAGNOSIS — Z1283 Encounter for screening for malignant neoplasm of skin: Secondary | ICD-10-CM | POA: Diagnosis not present

## 2016-06-28 DIAGNOSIS — L562 Photocontact dermatitis [berloque dermatitis]: Secondary | ICD-10-CM | POA: Diagnosis not present

## 2016-06-28 DIAGNOSIS — L908 Other atrophic disorders of skin: Secondary | ICD-10-CM | POA: Diagnosis not present

## 2016-07-11 DIAGNOSIS — M7050 Other bursitis of knee, unspecified knee: Secondary | ICD-10-CM | POA: Diagnosis not present

## 2016-07-23 DIAGNOSIS — H524 Presbyopia: Secondary | ICD-10-CM | POA: Diagnosis not present

## 2016-07-23 DIAGNOSIS — H40003 Preglaucoma, unspecified, bilateral: Secondary | ICD-10-CM | POA: Diagnosis not present

## 2016-07-25 DIAGNOSIS — M9903 Segmental and somatic dysfunction of lumbar region: Secondary | ICD-10-CM | POA: Diagnosis not present

## 2016-07-25 DIAGNOSIS — S338XXA Sprain of other parts of lumbar spine and pelvis, initial encounter: Secondary | ICD-10-CM | POA: Diagnosis not present

## 2016-08-13 DIAGNOSIS — M25562 Pain in left knee: Secondary | ICD-10-CM | POA: Diagnosis not present

## 2016-08-13 DIAGNOSIS — M7050 Other bursitis of knee, unspecified knee: Secondary | ICD-10-CM | POA: Diagnosis not present

## 2016-08-20 ENCOUNTER — Other Ambulatory Visit: Payer: Self-pay

## 2016-08-20 DIAGNOSIS — M9903 Segmental and somatic dysfunction of lumbar region: Secondary | ICD-10-CM | POA: Diagnosis not present

## 2016-08-20 DIAGNOSIS — S338XXA Sprain of other parts of lumbar spine and pelvis, initial encounter: Secondary | ICD-10-CM | POA: Diagnosis not present

## 2016-09-10 DIAGNOSIS — M25552 Pain in left hip: Secondary | ICD-10-CM | POA: Diagnosis not present

## 2016-09-10 DIAGNOSIS — M1712 Unilateral primary osteoarthritis, left knee: Secondary | ICD-10-CM | POA: Diagnosis not present

## 2016-09-10 DIAGNOSIS — M25562 Pain in left knee: Secondary | ICD-10-CM | POA: Diagnosis not present

## 2016-10-08 DIAGNOSIS — M1712 Unilateral primary osteoarthritis, left knee: Secondary | ICD-10-CM | POA: Diagnosis not present

## 2016-10-11 DIAGNOSIS — S83242A Other tear of medial meniscus, current injury, left knee, initial encounter: Secondary | ICD-10-CM | POA: Diagnosis not present

## 2016-10-11 DIAGNOSIS — M25562 Pain in left knee: Secondary | ICD-10-CM | POA: Diagnosis not present

## 2016-10-11 DIAGNOSIS — M1712 Unilateral primary osteoarthritis, left knee: Secondary | ICD-10-CM | POA: Diagnosis not present

## 2016-10-11 DIAGNOSIS — M948X6 Other specified disorders of cartilage, lower leg: Secondary | ICD-10-CM | POA: Diagnosis not present

## 2016-10-19 DIAGNOSIS — S83207D Unspecified tear of unspecified meniscus, current injury, left knee, subsequent encounter: Secondary | ICD-10-CM | POA: Diagnosis not present

## 2016-10-27 ENCOUNTER — Other Ambulatory Visit: Payer: Self-pay | Admitting: Nurse Practitioner

## 2016-11-18 ENCOUNTER — Other Ambulatory Visit: Payer: Self-pay | Admitting: Nurse Practitioner

## 2016-11-21 DIAGNOSIS — Z6823 Body mass index (BMI) 23.0-23.9, adult: Secondary | ICD-10-CM | POA: Diagnosis not present

## 2016-11-21 DIAGNOSIS — J019 Acute sinusitis, unspecified: Secondary | ICD-10-CM | POA: Diagnosis not present

## 2016-11-26 ENCOUNTER — Telehealth: Payer: Self-pay | Admitting: Internal Medicine

## 2016-11-26 MED ORDER — POLYETHYLENE GLYCOL 3350 17 GM/SCOOP PO POWD: ORAL | 2 refills | Status: DC

## 2016-11-26 NOTE — Addendum Note (Signed)
Addended by: Annitta Needs on: 11/26/2016 03:09 PM   Modules accepted: Orders

## 2016-11-26 NOTE — Telephone Encounter (Signed)
Noted. Med was sent to pts pharmacy.

## 2016-11-26 NOTE — Telephone Encounter (Signed)
[phone] please call patient, she has a problem with her prescription getting filled for the correct number of pills

## 2016-11-26 NOTE — Telephone Encounter (Signed)
Done

## 2016-11-26 NOTE — Telephone Encounter (Signed)
Pt called to ask if her Polyethylene powder quantity could be increase. 527 grams have been prescribed and isn't lasting pt. Pt use to take only 1/2 a tablespoon and is up to 1-2 tablespoons. Pts insurance will cover 1054 grams. Please advise.

## 2016-11-29 DIAGNOSIS — Z809 Family history of malignant neoplasm, unspecified: Secondary | ICD-10-CM | POA: Diagnosis not present

## 2016-11-29 DIAGNOSIS — F329 Major depressive disorder, single episode, unspecified: Secondary | ICD-10-CM | POA: Diagnosis not present

## 2016-11-29 DIAGNOSIS — Z90710 Acquired absence of both cervix and uterus: Secondary | ICD-10-CM | POA: Diagnosis not present

## 2016-11-29 DIAGNOSIS — Z8249 Family history of ischemic heart disease and other diseases of the circulatory system: Secondary | ICD-10-CM | POA: Diagnosis not present

## 2016-11-29 DIAGNOSIS — Z87442 Personal history of urinary calculi: Secondary | ICD-10-CM | POA: Diagnosis not present

## 2016-11-29 DIAGNOSIS — Z8489 Family history of other specified conditions: Secondary | ICD-10-CM | POA: Diagnosis not present

## 2016-11-29 DIAGNOSIS — M810 Age-related osteoporosis without current pathological fracture: Secondary | ICD-10-CM | POA: Diagnosis not present

## 2016-11-29 DIAGNOSIS — Z87891 Personal history of nicotine dependence: Secondary | ICD-10-CM | POA: Diagnosis not present

## 2016-11-29 DIAGNOSIS — M1712 Unilateral primary osteoarthritis, left knee: Secondary | ICD-10-CM | POA: Diagnosis not present

## 2016-11-29 DIAGNOSIS — S83242A Other tear of medial meniscus, current injury, left knee, initial encounter: Secondary | ICD-10-CM | POA: Diagnosis not present

## 2016-11-29 DIAGNOSIS — Z882 Allergy status to sulfonamides status: Secondary | ICD-10-CM | POA: Diagnosis not present

## 2016-11-29 DIAGNOSIS — I272 Pulmonary hypertension, unspecified: Secondary | ICD-10-CM | POA: Diagnosis not present

## 2016-11-29 DIAGNOSIS — F419 Anxiety disorder, unspecified: Secondary | ICD-10-CM | POA: Diagnosis not present

## 2016-11-29 DIAGNOSIS — E039 Hypothyroidism, unspecified: Secondary | ICD-10-CM | POA: Diagnosis not present

## 2016-11-29 DIAGNOSIS — K589 Irritable bowel syndrome without diarrhea: Secondary | ICD-10-CM | POA: Diagnosis not present

## 2016-11-29 DIAGNOSIS — G4700 Insomnia, unspecified: Secondary | ICD-10-CM | POA: Diagnosis not present

## 2016-11-29 DIAGNOSIS — Q210 Ventricular septal defect: Secondary | ICD-10-CM | POA: Diagnosis not present

## 2016-11-30 DIAGNOSIS — Q210 Ventricular septal defect: Secondary | ICD-10-CM | POA: Diagnosis not present

## 2016-11-30 DIAGNOSIS — S83242A Other tear of medial meniscus, current injury, left knee, initial encounter: Secondary | ICD-10-CM | POA: Diagnosis not present

## 2016-11-30 DIAGNOSIS — E039 Hypothyroidism, unspecified: Secondary | ICD-10-CM | POA: Diagnosis not present

## 2016-11-30 DIAGNOSIS — F329 Major depressive disorder, single episode, unspecified: Secondary | ICD-10-CM | POA: Diagnosis not present

## 2016-11-30 DIAGNOSIS — M1712 Unilateral primary osteoarthritis, left knee: Secondary | ICD-10-CM | POA: Diagnosis not present

## 2016-11-30 DIAGNOSIS — I272 Pulmonary hypertension, unspecified: Secondary | ICD-10-CM | POA: Diagnosis not present

## 2016-11-30 HISTORY — PX: KNEE ARTHROSCOPY: SUR90

## 2016-12-14 ENCOUNTER — Encounter: Payer: Self-pay | Admitting: Cardiology

## 2016-12-14 DIAGNOSIS — E038 Other specified hypothyroidism: Secondary | ICD-10-CM | POA: Diagnosis not present

## 2016-12-14 DIAGNOSIS — E7800 Pure hypercholesterolemia, unspecified: Secondary | ICD-10-CM | POA: Diagnosis not present

## 2016-12-14 DIAGNOSIS — Z1329 Encounter for screening for other suspected endocrine disorder: Secondary | ICD-10-CM | POA: Diagnosis not present

## 2016-12-14 DIAGNOSIS — Z0001 Encounter for general adult medical examination with abnormal findings: Secondary | ICD-10-CM | POA: Diagnosis not present

## 2016-12-14 DIAGNOSIS — Z79899 Other long term (current) drug therapy: Secondary | ICD-10-CM | POA: Diagnosis not present

## 2016-12-14 DIAGNOSIS — Z139 Encounter for screening, unspecified: Secondary | ICD-10-CM | POA: Diagnosis not present

## 2016-12-18 DIAGNOSIS — R011 Cardiac murmur, unspecified: Secondary | ICD-10-CM | POA: Diagnosis not present

## 2016-12-18 DIAGNOSIS — Z0001 Encounter for general adult medical examination with abnormal findings: Secondary | ICD-10-CM | POA: Diagnosis not present

## 2016-12-18 DIAGNOSIS — E038 Other specified hypothyroidism: Secondary | ICD-10-CM | POA: Diagnosis not present

## 2016-12-18 DIAGNOSIS — F418 Other specified anxiety disorders: Secondary | ICD-10-CM | POA: Diagnosis not present

## 2016-12-18 DIAGNOSIS — G4709 Other insomnia: Secondary | ICD-10-CM | POA: Diagnosis not present

## 2016-12-18 DIAGNOSIS — Z6823 Body mass index (BMI) 23.0-23.9, adult: Secondary | ICD-10-CM | POA: Diagnosis not present

## 2016-12-18 DIAGNOSIS — F3289 Other specified depressive episodes: Secondary | ICD-10-CM | POA: Diagnosis not present

## 2017-01-10 DIAGNOSIS — H25013 Cortical age-related cataract, bilateral: Secondary | ICD-10-CM | POA: Diagnosis not present

## 2017-01-10 DIAGNOSIS — H25813 Combined forms of age-related cataract, bilateral: Secondary | ICD-10-CM | POA: Diagnosis not present

## 2017-01-10 DIAGNOSIS — H43812 Vitreous degeneration, left eye: Secondary | ICD-10-CM | POA: Diagnosis not present

## 2017-01-10 DIAGNOSIS — H04123 Dry eye syndrome of bilateral lacrimal glands: Secondary | ICD-10-CM | POA: Diagnosis not present

## 2017-01-10 DIAGNOSIS — H2513 Age-related nuclear cataract, bilateral: Secondary | ICD-10-CM | POA: Diagnosis not present

## 2017-01-14 ENCOUNTER — Ambulatory Visit (INDEPENDENT_AMBULATORY_CARE_PROVIDER_SITE_OTHER): Payer: Medicare Other | Admitting: Cardiology

## 2017-01-14 ENCOUNTER — Encounter: Payer: Self-pay | Admitting: Cardiology

## 2017-01-14 VITALS — BP 138/88 | HR 57 | Ht 62.0 in | Wt 138.0 lb

## 2017-01-14 DIAGNOSIS — Q210 Ventricular septal defect: Secondary | ICD-10-CM | POA: Diagnosis not present

## 2017-01-14 NOTE — Patient Instructions (Signed)

## 2017-01-14 NOTE — Progress Notes (Signed)
Cardiology Office Note  Date: 01/14/2017   ID: [name], DOB 04/04/1950, MRN [id number]  PCP: Kristi Percy, MD  Primary Cardiologist: Kristi Lesches, MD   Chief Complaint  Patient presents with  . Ventricular Septal Defect    History of Present Illness: [name] is a 66 y.o. female last seen in December 2017. She presents for a routine follow-up visit. Reports no major change in stamina, no increasing shortness of breath, exertional chest pain, or syncope. No fevers or chills.  I reviewed her medications which are outlined below. She continues to follow with Dr. Nadara [name]. She has had some symptoms that sound like restless leg syndrome, no claudication however.  I personally reviewed her ECG today which shows sinus bradycardia with R' in lead V1.  I reviewed her echocardiogram from 2016.  Past Medical History:  Diagnosis Date  . Chronic constipation   . Diverticulosis   . Hypothyroidism   . 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 Ventricular septal defect (VSD), membranous    Documented by echocardiogram 2011    Past Surgical History:  Procedure Laterality Date  . ABDOMINAL HYSTERECTOMY    . BREAST EXCISIONAL BIOPSY Right 1996  . BREAST LUMPECTOMY    . COLONOSCOPY   02/03/2003   RMR: Normal rectum and colon  . COLONOSCOPY N/A 03/10/2013   Dr. Gala [name]-  minimal internal hemorrhoids/ anal papilla o/w normal appearing rectum. colonic diverticulosis  . TONSILLECTOMY      Current Outpatient Medications  Medication Sig Dispense Refill  . ALPRAZolam (XANAX) 0.25 MG tablet Take 0.25 mg by mouth at bedtime as needed. May take one tablet once every month or two    . Ascorbic Acid (VITAMIN C) 1000 MG tablet Take 1,000 mg by mouth daily.    . Calcium Citrate-Vitamin D (CALCIUM + D PO) Take 1,200 mg by mouth daily.    Marland Kitchen desonide (DESOWEN) 0.05 % cream Apply 1 application topically as needed. ONCE DAILY IF NEEDED  3  . diphenhydrAMINE (BENADRYL) 25  MG tablet Take 25 mg by mouth 2 (two) times daily as needed. Patient alternates with Xyzal    . EPINEPHrine (EPIPEN 2-PAK) 0.3 mg/0.3 mL IJ SOAJ injection Inject 0.3 mg into the muscle once. Reported on 01/25/2015    . fish oil-omega-3 fatty acids 1000 MG capsule Take 1 g by mouth daily.     Marland Kitchen levocetirizine (XYZAL) 5 MG tablet TAKE 1 TABLET (5 MG TOTAL) BY MOUTH DAILY. 30 tablet 1  . levothyroxine (SYNTHROID, LEVOTHROID) 75 MCG tablet Take 75 mcg by mouth daily.     . montelukast (SINGULAIR) 10 MG tablet TAKE 1 TABLET (10 MG TOTAL) BY MOUTH AT BEDTIME. 30 tablet 0  . Multiple Vitamin (MULTIVITAMIN) capsule Take 1 capsule by mouth daily.    . NON FORMULARY Take 1 tablet by mouth daily. Tumeric    . polyethylene glycol powder (GLYCOLAX/MIRALAX) powder MIX AND TAKE ONE TO TWO CAPFULS NIGHTLY AS NEEDED. 1054 g 2  . Potassium 95 MG TABS Take 1 tablet by mouth daily as needed.    Marland Kitchen PREMARIN 0.625 MG tablet Take 0.625 mg by mouth daily.     . Probiotic Product (PROBIOTIC DAILY PO) Take by mouth.    . vitamin E 400 UNIT capsule Take 400 Units by mouth daily.     No current facility-administered medications for this visit.    Allergies:  Sulfa antibiotics   Social History: The patient  reports that she  quit smoking about 48 years ago. Her smoking use included cigarettes. She started smoking about 53 years ago. She has a 2.50 pack-year smoking history. she has never used smokeless tobacco. She reports that she drinks alcohol. She reports that she does not use drugs.   ROS:  Please see the history of present illness. Otherwise, complete review of systems is positive for none.  All other systems are reviewed and negative.   Physical Exam: VS:  BP 138/88   Pulse (!) 57   Ht 5\' 2"  (1.575 m)   Wt 138 lb (62.6 kg)   SpO2 98%   BMI 25.24 kg/m , BMI Body mass index is 25.24 kg/m.  Wt Readings from Last 3 Encounters:  01/14/17 138 lb (62.6 kg)  01/25/16 129 lb (58.5 kg)  01/26/15 123 lb 12.8 oz  (56.2 kg)    General: Patient appears comfortable at rest. HEENT: Conjunctiva and lids normal, oropharynx clear. Neck: Supple, no elevated JVP or carotid bruits, no thyromegaly. Lungs: Clear to auscultation, nonlabored breathing at rest. Cardiac: Regular rate and rhythm, no S3, 6-0/7 systolic murmur, no pericardial rub. Abdomen: Soft, nontender, bowel sounds present. Extremities: No pitting edema, distal pulses 2+.  ECG: I personally reviewed the tracing from 01/25/2016 which showed sinus bradycardia.  Other Studies Reviewed Today:  Exercise Cardiolite 09/06/2014:  Blood pressure demonstrated a hypertensive response to exercise.  T wave inversion was noted during stress in the V6, II, III, aVF and V5 leads, beginning at 6 minutes of stress, and returning to baseline after 5-9 mins of recovery.  This is a low risk study. Normal myocardial perfusion.  The left ventricular ejection fraction is hyperdynamic (>65%).  Nuclear stress EF: 73%.  Echocardiogram 01/12/2015: Study Conclusions  - Left ventricle: The cavity size was normal. Wall thickness was normal. Systolic function was normal. The estimated ejection fraction was in the range of 60% to 65%. Wall motion was normal; there were no regional wall motion abnormalities. Doppler parameters are consistent with abnormal left ventricular relaxation (grade 1 diastolic dysfunction). - Ventricular septum: Membranous VSD noted with left-to-right shunting. - Aortic valve: Trileaflet; mildly thickened leaflets. There was trivial regurgitation. - Mitral valve: Mildly calcified leaflets . There was mild to moderate regurgitation directed posteriorly. - Left atrium: The atrium was moderately dilated. - Right ventricle: Systolic function was normal. - Right atrium: Central venous pressure (est): 3 mm Hg. - Tricuspid valve: There was mild regurgitation. - Pulmonary arteries: PA peak pressure: 37 mm Hg (S). - Pericardium,  extracardiac: There was no pericardial effusion.  Impressions:  - Normal LV wall thickness with LVEF 60-65% and grade 1 diastolic dysfunction. Moderate left atrial enlargement. Mildly calcified mitral leaflets with mild to moderate, posteriorly directed mitral regurgitation. Mildly thickened aortic valve with trivial aortic regurgitation. Membranous VSD noted as before with left to right shunting. Visualization of the RVOT is not adequate to allow for calculation of Qp/Qs. Right ventricular chamber size and contraction are normal, arguing against a significant left-to-right shunt. Mild tricuspid regurgitation, PASP estimated at 37 mmHg.  Assessment and Plan:  Membranous VSD with left-to-right shunt. No significant change in murmur. Last echocardiogram was in 2016 as outlined above at which point right ventricular contraction was normal and PASP was only 37 mmHg. We will continue to follow conservatively at this time.  Current medicines were reviewed with the patient today.   Orders Placed This Encounter  Procedures  . EKG 12-Lead    Disposition: Follow-up in one year.  Signed, Aloha Gell  Domenic Polite, MD, [facility] 01/14/2017 2:52 PM    [facility] at [place], [place], [place] [address] Phone: [phone]; Fax: [phone]

## 2017-01-31 DIAGNOSIS — M1712 Unilateral primary osteoarthritis, left knee: Secondary | ICD-10-CM | POA: Diagnosis not present

## 2017-01-31 DIAGNOSIS — M25562 Pain in left knee: Secondary | ICD-10-CM | POA: Diagnosis not present

## 2017-02-01 DIAGNOSIS — H25013 Cortical age-related cataract, bilateral: Secondary | ICD-10-CM | POA: Diagnosis not present

## 2017-02-01 DIAGNOSIS — H43812 Vitreous degeneration, left eye: Secondary | ICD-10-CM | POA: Diagnosis not present

## 2017-02-01 DIAGNOSIS — H2513 Age-related nuclear cataract, bilateral: Secondary | ICD-10-CM | POA: Diagnosis not present

## 2017-02-01 DIAGNOSIS — H16142 Punctate keratitis, left eye: Secondary | ICD-10-CM | POA: Diagnosis not present

## 2017-02-01 DIAGNOSIS — H40033 Anatomical narrow angle, bilateral: Secondary | ICD-10-CM | POA: Diagnosis not present

## 2017-02-01 DIAGNOSIS — H25813 Combined forms of age-related cataract, bilateral: Secondary | ICD-10-CM | POA: Diagnosis not present

## 2017-02-06 DIAGNOSIS — S338XXA Sprain of other parts of lumbar spine and pelvis, initial encounter: Secondary | ICD-10-CM | POA: Diagnosis not present

## 2017-02-06 DIAGNOSIS — M9903 Segmental and somatic dysfunction of lumbar region: Secondary | ICD-10-CM | POA: Diagnosis not present

## 2017-02-08 DIAGNOSIS — S338XXA Sprain of other parts of lumbar spine and pelvis, initial encounter: Secondary | ICD-10-CM | POA: Diagnosis not present

## 2017-02-08 DIAGNOSIS — M9903 Segmental and somatic dysfunction of lumbar region: Secondary | ICD-10-CM | POA: Diagnosis not present

## 2017-02-13 DIAGNOSIS — M9903 Segmental and somatic dysfunction of lumbar region: Secondary | ICD-10-CM | POA: Diagnosis not present

## 2017-02-13 DIAGNOSIS — S338XXA Sprain of other parts of lumbar spine and pelvis, initial encounter: Secondary | ICD-10-CM | POA: Diagnosis not present

## 2017-02-14 DIAGNOSIS — S338XXA Sprain of other parts of lumbar spine and pelvis, initial encounter: Secondary | ICD-10-CM | POA: Diagnosis not present

## 2017-02-14 DIAGNOSIS — M9903 Segmental and somatic dysfunction of lumbar region: Secondary | ICD-10-CM | POA: Diagnosis not present

## 2017-02-27 DIAGNOSIS — M9903 Segmental and somatic dysfunction of lumbar region: Secondary | ICD-10-CM | POA: Diagnosis not present

## 2017-02-27 DIAGNOSIS — S338XXA Sprain of other parts of lumbar spine and pelvis, initial encounter: Secondary | ICD-10-CM | POA: Diagnosis not present

## 2017-02-28 DIAGNOSIS — D225 Melanocytic nevi of trunk: Secondary | ICD-10-CM | POA: Diagnosis not present

## 2017-02-28 DIAGNOSIS — L250 Unspecified contact dermatitis due to cosmetics: Secondary | ICD-10-CM | POA: Diagnosis not present

## 2017-02-28 DIAGNOSIS — L908 Other atrophic disorders of skin: Secondary | ICD-10-CM | POA: Diagnosis not present

## 2017-02-28 DIAGNOSIS — Z1283 Encounter for screening for malignant neoplasm of skin: Secondary | ICD-10-CM | POA: Diagnosis not present

## 2017-03-06 ENCOUNTER — Other Ambulatory Visit: Payer: Self-pay | Admitting: Family Medicine

## 2017-03-06 DIAGNOSIS — Z1231 Encounter for screening mammogram for malignant neoplasm of breast: Secondary | ICD-10-CM

## 2017-03-06 DIAGNOSIS — S338XXA Sprain of other parts of lumbar spine and pelvis, initial encounter: Secondary | ICD-10-CM | POA: Diagnosis not present

## 2017-03-06 DIAGNOSIS — M9903 Segmental and somatic dysfunction of lumbar region: Secondary | ICD-10-CM | POA: Diagnosis not present

## 2017-03-13 DIAGNOSIS — S338XXA Sprain of other parts of lumbar spine and pelvis, initial encounter: Secondary | ICD-10-CM | POA: Diagnosis not present

## 2017-03-13 DIAGNOSIS — M9903 Segmental and somatic dysfunction of lumbar region: Secondary | ICD-10-CM | POA: Diagnosis not present

## 2017-03-20 DIAGNOSIS — S338XXA Sprain of other parts of lumbar spine and pelvis, initial encounter: Secondary | ICD-10-CM | POA: Diagnosis not present

## 2017-03-20 DIAGNOSIS — M9903 Segmental and somatic dysfunction of lumbar region: Secondary | ICD-10-CM | POA: Diagnosis not present

## 2017-04-01 DIAGNOSIS — M9903 Segmental and somatic dysfunction of lumbar region: Secondary | ICD-10-CM | POA: Diagnosis not present

## 2017-04-01 DIAGNOSIS — S338XXA Sprain of other parts of lumbar spine and pelvis, initial encounter: Secondary | ICD-10-CM | POA: Diagnosis not present

## 2017-04-04 ENCOUNTER — Ambulatory Visit
Admission: RE | Admit: 2017-04-04 | Discharge: 2017-04-04 | Disposition: A | Discharge status: Home / Self Care | Payer: Medicare Other | Source: Ambulatory Visit | Attending: Family Medicine | Admitting: Family Medicine

## 2017-04-04 DIAGNOSIS — Z1231 Encounter for screening mammogram for malignant neoplasm of breast: Secondary | ICD-10-CM | POA: Diagnosis not present

## 2017-04-04 DIAGNOSIS — M170 Bilateral primary osteoarthritis of knee: Secondary | ICD-10-CM | POA: Diagnosis not present

## 2017-04-04 DIAGNOSIS — Z9889 Other specified postprocedural states: Secondary | ICD-10-CM | POA: Diagnosis not present

## 2017-04-04 DIAGNOSIS — M7050 Other bursitis of knee, unspecified knee: Secondary | ICD-10-CM | POA: Diagnosis not present

## 2017-04-04 DIAGNOSIS — M25462 Effusion, left knee: Secondary | ICD-10-CM | POA: Diagnosis not present

## 2017-04-17 DIAGNOSIS — M9903 Segmental and somatic dysfunction of lumbar region: Secondary | ICD-10-CM | POA: Diagnosis not present

## 2017-04-17 DIAGNOSIS — S338XXA Sprain of other parts of lumbar spine and pelvis, initial encounter: Secondary | ICD-10-CM | POA: Diagnosis not present

## 2017-05-01 DIAGNOSIS — M9903 Segmental and somatic dysfunction of lumbar region: Secondary | ICD-10-CM | POA: Diagnosis not present

## 2017-05-01 DIAGNOSIS — S338XXA Sprain of other parts of lumbar spine and pelvis, initial encounter: Secondary | ICD-10-CM | POA: Diagnosis not present

## 2017-05-07 DIAGNOSIS — M25562 Pain in left knee: Secondary | ICD-10-CM | POA: Diagnosis not present

## 2017-05-07 DIAGNOSIS — Z6823 Body mass index (BMI) 23.0-23.9, adult: Secondary | ICD-10-CM | POA: Diagnosis not present

## 2017-05-15 DIAGNOSIS — S338XXA Sprain of other parts of lumbar spine and pelvis, initial encounter: Secondary | ICD-10-CM | POA: Diagnosis not present

## 2017-05-15 DIAGNOSIS — M9903 Segmental and somatic dysfunction of lumbar region: Secondary | ICD-10-CM | POA: Diagnosis not present

## 2017-05-16 DIAGNOSIS — M25562 Pain in left knee: Secondary | ICD-10-CM | POA: Diagnosis not present

## 2017-05-16 DIAGNOSIS — M25462 Effusion, left knee: Secondary | ICD-10-CM | POA: Diagnosis not present

## 2017-05-16 DIAGNOSIS — Z9889 Other specified postprocedural states: Secondary | ICD-10-CM | POA: Diagnosis not present

## 2017-05-16 DIAGNOSIS — M1712 Unilateral primary osteoarthritis, left knee: Secondary | ICD-10-CM | POA: Diagnosis not present

## 2017-05-29 DIAGNOSIS — S338XXA Sprain of other parts of lumbar spine and pelvis, initial encounter: Secondary | ICD-10-CM | POA: Diagnosis not present

## 2017-05-29 DIAGNOSIS — M9903 Segmental and somatic dysfunction of lumbar region: Secondary | ICD-10-CM | POA: Diagnosis not present

## 2017-06-18 DIAGNOSIS — S338XXA Sprain of other parts of lumbar spine and pelvis, initial encounter: Secondary | ICD-10-CM | POA: Diagnosis not present

## 2017-06-18 DIAGNOSIS — M9903 Segmental and somatic dysfunction of lumbar region: Secondary | ICD-10-CM | POA: Diagnosis not present

## 2017-06-19 DIAGNOSIS — R002 Palpitations: Secondary | ICD-10-CM | POA: Diagnosis not present

## 2017-06-19 DIAGNOSIS — Z6824 Body mass index (BMI) 24.0-24.9, adult: Secondary | ICD-10-CM | POA: Diagnosis not present

## 2017-06-19 DIAGNOSIS — M25462 Effusion, left knee: Secondary | ICD-10-CM | POA: Diagnosis not present

## 2017-06-19 DIAGNOSIS — R011 Cardiac murmur, unspecified: Secondary | ICD-10-CM | POA: Diagnosis not present

## 2017-06-19 DIAGNOSIS — R300 Dysuria: Secondary | ICD-10-CM | POA: Diagnosis not present

## 2017-06-19 DIAGNOSIS — M25562 Pain in left knee: Secondary | ICD-10-CM | POA: Diagnosis not present

## 2017-06-19 DIAGNOSIS — M1712 Unilateral primary osteoarthritis, left knee: Secondary | ICD-10-CM | POA: Diagnosis not present

## 2017-06-19 DIAGNOSIS — G4709 Other insomnia: Secondary | ICD-10-CM | POA: Diagnosis not present

## 2017-07-03 DIAGNOSIS — M9903 Segmental and somatic dysfunction of lumbar region: Secondary | ICD-10-CM | POA: Diagnosis not present

## 2017-07-03 DIAGNOSIS — S338XXA Sprain of other parts of lumbar spine and pelvis, initial encounter: Secondary | ICD-10-CM | POA: Diagnosis not present

## 2017-07-04 DIAGNOSIS — M9903 Segmental and somatic dysfunction of lumbar region: Secondary | ICD-10-CM | POA: Diagnosis not present

## 2017-07-04 DIAGNOSIS — S338XXA Sprain of other parts of lumbar spine and pelvis, initial encounter: Secondary | ICD-10-CM | POA: Diagnosis not present

## 2017-07-09 DIAGNOSIS — M25562 Pain in left knee: Secondary | ICD-10-CM | POA: Diagnosis not present

## 2017-07-09 DIAGNOSIS — M1712 Unilateral primary osteoarthritis, left knee: Secondary | ICD-10-CM | POA: Diagnosis not present

## 2017-07-16 ENCOUNTER — Ambulatory Visit: Payer: Self-pay | Admitting: Orthopedic Surgery

## 2017-07-17 DIAGNOSIS — M1712 Unilateral primary osteoarthritis, left knee: Secondary | ICD-10-CM | POA: Diagnosis not present

## 2017-07-18 DIAGNOSIS — L308 Other specified dermatitis: Secondary | ICD-10-CM | POA: Diagnosis not present

## 2017-07-21 NOTE — Progress Notes (Signed)
Cardiology Office Note  Date: 07/22/2017   ID: [name], DOB 1950/12/29, MRN [id number]  PCP: Rory Percy, MD  Primary Cardiologist: Rozann Lesches, MD   Chief Complaint  Patient presents with  . Preoperative evaluation    History of Present Illness: [name] is a 67 y.o. female last seen in December 2018.  She presents for a follow-up visit. She is pending left total knee arthroplasty with Dr. Lyla Glassing in July.  Complains of left knee pain that has been progressive over the last few months.  From a cardiac perspective she has had no increasing shortness of breath with typical activities, no exertional chest pain.  She does describe an episode of chest pain that occurred when she was in the [place] back in January, had been outside in the sun relaxing, later came in to take a shower when she developed chest pain that resolved spontaneously.  She had an episode more recently in May where she felt like her heart was racing after being outdoors in the sun and taking a shower, had no palpitations or syncope.  Her Apple Watch recorded heart rate as fast as 201, no rhythm to review.  This was an isolated event so far.  I personally reviewed her follow-up ECG today which shows normal sinus rhythm.   Last echocardiogram was in 2016.  Past Medical History:  Diagnosis Date  . Chronic constipation   . Diverticulosis   . Hypothyroidism   . 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 Ventricular septal defect (VSD), membranous    Documented by echocardiogram 2011    Past Surgical History:  Procedure Laterality Date  . ABDOMINAL HYSTERECTOMY    . BREAST EXCISIONAL BIOPSY Right 1996  . BREAST LUMPECTOMY Right    benign  . COLONOSCOPY   02/03/2003   RMR: Normal rectum and colon  . COLONOSCOPY N/A 03/10/2013   Dr. Gala Romney-  minimal internal hemorrhoids/ anal papilla o/w normal appearing rectum. colonic diverticulosis  . TONSILLECTOMY      Current  Outpatient Medications  Medication Sig Dispense Refill  . ALPRAZolam (XANAX) 0.25 MG tablet Take 0.25 mg by mouth at bedtime as needed. May take one tablet once every month or two    . Ascorbic Acid (VITAMIN C) 1000 MG tablet Take 1,000 mg by mouth daily.    . Calcium Citrate-Vitamin D (CALCIUM + D PO) Take 1,200 mg by mouth daily.    . Cyanocobalamin (VITAMIN B-12) 1000 MCG SUBL Place 1 tablet under the tongue daily.    Marland Kitchen desonide (DESOWEN) 0.05 % cream Apply 1 application topically as needed. ONCE DAILY IF NEEDED  3  . diphenhydrAMINE (BENADRYL) 25 MG tablet Take 25 mg by mouth 2 (two) times daily as needed. Patient alternates with Xyzal    . EPINEPHrine (EPIPEN 2-PAK) 0.3 mg/0.3 mL IJ SOAJ injection Inject 0.3 mg into the muscle once. Reported on 01/25/2015    . fish oil-omega-3 fatty acids 1000 MG capsule Take 1 g by mouth daily.     Marland Kitchen levocetirizine (XYZAL) 5 MG tablet TAKE 1 TABLET (5 MG TOTAL) BY MOUTH DAILY. 30 tablet 1  . levothyroxine (SYNTHROID, LEVOTHROID) 75 MCG tablet Take 75 mcg by mouth daily.     . Melatonin 10 MG TABS Take 1 tablet by mouth as needed.    . montelukast (SINGULAIR) 10 MG tablet TAKE 1 TABLET (10 MG TOTAL) BY MOUTH AT BEDTIME. 30 tablet 0  . Multiple Vitamin (MULTIVITAMIN)  capsule Take 1 capsule by mouth daily.    . NON FORMULARY Take 1 tablet by mouth daily. Tumeric    . polyethylene glycol powder (GLYCOLAX/MIRALAX) powder MIX AND TAKE ONE TO TWO CAPFULS NIGHTLY AS NEEDED. 1054 g 2  . Potassium 95 MG TABS Take 1 tablet by mouth daily as needed.    Marland Kitchen PREMARIN 0.625 MG tablet Take 0.625 mg by mouth daily.     . Probiotic Product (PROBIOTIC DAILY PO) Take by mouth.    . TURMERIC PO Take 1 capsule by mouth 2 (two) times daily.    . vitamin E 400 UNIT capsule Take 400 Units by mouth daily.     No current facility-administered medications for this visit.    Allergies:  Sulfa antibiotics   Social History: The patient  reports that she quit smoking about 48 years  ago. Her smoking use included cigarettes. She started smoking about 54 years ago. She has a 2.50 pack-year smoking history. She has never used smokeless tobacco. She reports that she drinks alcohol. She reports that she does not use drugs.   ROS:  Please see the history of present illness. Otherwise, complete review of systems is positive for none.  All other systems are reviewed and negative.   Physical Exam: VS:  BP 124/72   Pulse 61   Ht 5\' 2"  (1.575 m)   Wt 131 lb 9.6 oz (59.7 kg)   SpO2 99%   BMI 24.07 kg/m , BMI Body mass index is 24.07 kg/m.  Wt Readings from Last 3 Encounters:  07/22/17 131 lb 9.6 oz (59.7 kg)  01/14/17 138 lb (62.6 kg)  01/25/16 129 lb (58.5 kg)    General: Patient appears comfortable at rest. HEENT: Conjunctiva and lids normal, oropharynx clear. Neck: Supple, no elevated JVP or carotid bruits, no thyromegaly. Lungs: Clear to auscultation, nonlabored breathing at rest. Cardiac: Regular rate and rhythm, no S3, 3-5/0 systolic murmur, no pericardial rub. Abdomen: Soft, nontender, bowel sounds present. Extremities: No pitting edema, distal pulses 2+. Skin: Warm and dry. Musculoskeletal: No kyphosis. Neuropsychiatric: Alert and oriented x3, affect grossly appropriate.  ECG: I personally reviewed the tracing from 01/14/2017 which shows sinus bradycardia with R' in lead V1.  Recent Labwork:  November 2018: BUN 17, creatinine 0.79, potassium 4.6, AST 16, ALT 12, cholesterol 210, triglycerides 72, HDL 99, LDL 97, hemoglobin 13.5, platelets 221, TSH 2.99  Other Studies Reviewed Today:  Exercise Cardiolite 09/06/2014:  Blood pressure demonstrated a hypertensive response to exercise.  T wave inversion was noted during stress in the V6, II, III, aVF and V5 leads, beginning at 6 minutes of stress, and returning to baseline after 5-9 mins of recovery.  This is a low risk study. Normal myocardial perfusion.  The left ventricular ejection fraction is hyperdynamic  (>65%).  Nuclear stress EF: 73%.  Echocardiogram 01/12/2015: Study Conclusions  - Left ventricle: The cavity size was normal. Wall thickness was normal. Systolic function was normal. The estimated ejection fraction was in the range of 60% to 65%. Wall motion was normal; there were no regional wall motion abnormalities. Doppler parameters are consistent with abnormal left ventricular relaxation (grade 1 diastolic dysfunction). - Ventricular septum: Membranous VSD noted with left-to-right shunting. - Aortic valve: Trileaflet; mildly thickened leaflets. There was trivial regurgitation. - Mitral valve: Mildly calcified leaflets . There was mild to moderate regurgitation directed posteriorly. - Left atrium: The atrium was moderately dilated. - Right ventricle: Systolic function was normal. - Right atrium: Central venous pressure (est):  3 mm Hg. - Tricuspid valve: There was mild regurgitation. - Pulmonary arteries: PA peak pressure: 37 mm Hg (S). - Pericardium, extracardiac: There was no pericardial effusion.  Impressions:  - Normal LV wall thickness with LVEF 60-65% and grade 1 diastolic dysfunction. Moderate left atrial enlargement. Mildly calcified mitral leaflets with mild to moderate, posteriorly directed mitral regurgitation. Mildly thickened aortic valve with trivial aortic regurgitation. Membranous VSD noted as before with left to right shunting. Visualization of the RVOT is not adequate to allow for calculation of Qp/Qs. Right ventricular chamber size and contraction are normal, arguing against a significant left-to-right shunt. Mild tricuspid regurgitation, PASP estimated at 37 mmHg.  Assessment and Plan:  1.  Preoperative cardiac assessment in a 67 year old woman with a history of membranous VSD, not clearly associated with any right ventricular compromise but previously elevated pulmonary pressures, last noted to be 37 mmHg in 2016.   Heart murmur is stable.  ECG normal today.  We will plan a follow-up echocardiogram to ensure no major change, but but would not anticipate any additional testing prior to planned left total knee arthroplasty in July.  Overall perioperative cardiac risk anticipated to be low.  2.  Isolated episode of chest pain back in January and more recently brief rapid palpitations in May.  Etiology uncertain but likely represented some type of transient arrhythmia, potentially SVT or other atrial arrhythmia.  She has had no syncope, ECG is normal today.  We will reassess LVEF by follow-up echocardiogram, anticipate observation for now.  We might consider further outpatient cardiac monitoring if symptoms increase in frequency.  Heart rate at rest is generally in the high 50s to low 60s so not starting beta-blocker at this time.  3.  Prior history of pulmonary hypertension, to be reassessed by echocardiogram.  Current medicines were reviewed with the patient today.   Orders Placed This Encounter  Procedures  . EKG 12-Lead  . ECHOCARDIOGRAM COMPLETE    Disposition: Follow-up in 6 months.  Signed, Satira Sark, MD, [facility] 07/22/2017 12:50 PM    [facility] [address], [place], [place] [address] Phone: [phone]; Fax: [phone]

## 2017-07-22 ENCOUNTER — Telehealth: Payer: Self-pay | Admitting: Cardiology

## 2017-07-22 ENCOUNTER — Encounter: Payer: Self-pay | Admitting: Cardiology

## 2017-07-22 ENCOUNTER — Ambulatory Visit (INDEPENDENT_AMBULATORY_CARE_PROVIDER_SITE_OTHER): Payer: Medicare Other | Admitting: Cardiology

## 2017-07-22 ENCOUNTER — Encounter

## 2017-07-22 VITALS — BP 124/72 | HR 61 | Ht 62.0 in | Wt 131.6 lb

## 2017-07-22 DIAGNOSIS — Z01810 Encounter for preprocedural cardiovascular examination: Secondary | ICD-10-CM | POA: Diagnosis not present

## 2017-07-22 DIAGNOSIS — I272 Pulmonary hypertension, unspecified: Secondary | ICD-10-CM

## 2017-07-22 DIAGNOSIS — Q210 Ventricular septal defect: Secondary | ICD-10-CM

## 2017-07-22 DIAGNOSIS — R002 Palpitations: Secondary | ICD-10-CM

## 2017-07-22 NOTE — Patient Instructions (Signed)

## 2017-07-22 NOTE — Telephone Encounter (Signed)
Pre-cert Verification for the following procedure   Echo scheduled for 07-25-17 at [facility]

## 2017-07-24 ENCOUNTER — Ambulatory Visit: Payer: Self-pay | Admitting: Orthopedic Surgery

## 2017-07-24 NOTE — H&P (Signed)
TOTAL KNEE ADMISSION H&P  Patient is being admitted for left total knee arthroplasty.  Subjective:  Chief Complaint:left knee pain.  HPI: [name], 67 y.o. female, has a history of pain and functional disability in the left knee due to arthritis and has failed non-surgical conservative treatments for greater than 12 weeks to includeNSAID's and/or analgesics, corticosteriod injections, viscosupplementation injections, flexibility and strengthening excercises, use of assistive devices, weight reduction as appropriate and activity modification.  Onset of symptoms was gradual, starting 3 years ago with gradually worsening course since that time. The patient noted prior procedures on the knee to include  arthroscopy and menisectomy on the left knee(s).  Patient currently rates pain in the left knee(s) at 10 out of 10 with activity. Patient has night pain, worsening of pain with activity and weight bearing, pain that interferes with activities of daily living, pain with passive range of motion and joint swelling.  Patient has evidence of subchondral cysts, subchondral sclerosis, periarticular osteophytes and joint space narrowing by imaging studies. There is no active infection.  Patient Active Problem List   Diagnosis Date Noted  . Allergic rhinoconjunctivitis 11/16/2014  . Ventricular septal defect (VSD), membranous 10/22/2012  . HYPOTHYROIDISM 09/03/2007   Past Medical History:  Diagnosis Date  . Chronic constipation   . Diverticulosis   . Hypothyroidism   . 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 Ventricular septal defect (VSD), membranous    Documented by echocardiogram 2011    Past Surgical History:  Procedure Laterality Date  . ABDOMINAL HYSTERECTOMY    . BREAST EXCISIONAL BIOPSY Right 1996  . BREAST LUMPECTOMY Right    benign  . COLONOSCOPY   02/03/2003   RMR: Normal rectum and colon  . COLONOSCOPY N/A 03/10/2013   Dr. Gala Romney-  minimal internal hemorrhoids/  anal papilla o/w normal appearing rectum. colonic diverticulosis  . TONSILLECTOMY      Current Outpatient Medications  Medication Sig Dispense Refill Last Dose  . ALPRAZolam (XANAX) 0.25 MG tablet Take 0.25 mg by mouth at bedtime as needed. May take one tablet once every month or two   Taking  . Ascorbic Acid (VITAMIN C) 1000 MG tablet Take 1,000 mg by mouth daily.   Taking  . Calcium Citrate-Vitamin D (CALCIUM + D PO) Take 1,200 mg by mouth daily.   Taking  . Cyanocobalamin (VITAMIN B-12) 1000 MCG SUBL Place 1 tablet under the tongue daily.   Taking  . desonide (DESOWEN) 0.05 % cream Apply 1 application topically as needed. ONCE DAILY IF NEEDED  3 Taking  . diphenhydrAMINE (BENADRYL) 25 MG tablet Take 25 mg by mouth 2 (two) times daily as needed. Patient alternates with Xyzal   Taking  . EPINEPHrine (EPIPEN 2-PAK) 0.3 mg/0.3 mL IJ SOAJ injection Inject 0.3 mg into the muscle once. Reported on 01/25/2015   Taking  . fish oil-omega-3 fatty acids 1000 MG capsule Take 1 g by mouth daily.    Taking  . levocetirizine (XYZAL) 5 MG tablet TAKE 1 TABLET (5 MG TOTAL) BY MOUTH DAILY. 30 tablet 1 Taking  . levothyroxine (SYNTHROID, LEVOTHROID) 75 MCG tablet Take 75 mcg by mouth daily.    Taking  . Melatonin 10 MG TABS Take 1 tablet by mouth as needed.   Taking  . montelukast (SINGULAIR) 10 MG tablet TAKE 1 TABLET (10 MG TOTAL) BY MOUTH AT BEDTIME. 30 tablet 0 Taking  . Multiple Vitamin (MULTIVITAMIN) capsule Take 1 capsule by mouth daily.   Taking  .  NON FORMULARY Take 1 tablet by mouth daily. Tumeric   Taking  . polyethylene glycol powder (GLYCOLAX/MIRALAX) powder MIX AND TAKE ONE TO TWO CAPFULS NIGHTLY AS NEEDED. 1054 g 2 Taking  . Potassium 95 MG TABS Take 1 tablet by mouth daily as needed.   Taking  . PREMARIN 0.625 MG tablet Take 0.625 mg by mouth daily.    Taking  . Probiotic Product (PROBIOTIC DAILY PO) Take by mouth.   Taking  . TURMERIC PO Take 1 capsule by mouth 2 (two) times daily.   Taking   . vitamin E 400 UNIT capsule Take 400 Units by mouth daily.   Taking   No current facility-administered medications for this visit.    Allergies  Allergen Reactions  . Sulfa Antibiotics Rash    Social History   Tobacco Use  . Smoking status: Former Smoker    Packs/day: 0.50    Years: 5.00    Pack years: 2.50    Types: Cigarettes    Start date: 05/18/1963    Last attempt to quit: 10/23/1968    Years since quitting: 48.7  . Smokeless tobacco: Never Used  . Tobacco comment: smoked in her 14s  Substance Use Topics  . Alcohol use: Yes    Alcohol/week: 0.0 oz    Comment: Socially    Family History  Problem Relation Age of Onset  . Hypertension Father   . Heart attack Father   . Hypertension Sister   . Colon cancer Neg Hx      Review of Systems  Constitutional: Negative.   HENT: Negative.   Eyes: Negative.   Respiratory: Negative.   Cardiovascular: Negative.   Gastrointestinal: Negative.   Genitourinary: Negative.   Musculoskeletal: Positive for back pain and joint pain.  Skin: Positive for itching and rash.  Neurological: Positive for dizziness.  Endo/Heme/Allergies: Negative.   Psychiatric/Behavioral: Negative.     Objective:  Physical Exam  Vitals reviewed. Constitutional: She is oriented to person, place, and time. She appears well-developed and well-nourished.  HENT:  Head: Normocephalic and atraumatic.  Eyes: Pupils are equal, round, and reactive to light. Conjunctivae and EOM are normal.  Neck: Normal range of motion. Neck supple.  Cardiovascular: Normal rate, normal heart sounds and intact distal pulses.  Respiratory: Effort normal and breath sounds normal. No respiratory distress.  GI: Soft. Bowel sounds are normal. She exhibits no distension.  Genitourinary:  Genitourinary Comments: deferred  Musculoskeletal:       Left knee: She exhibits decreased range of motion, swelling and abnormal alignment. Tenderness found. Medial joint line and lateral joint  line tenderness noted.  Neurological: She is alert and oriented to person, place, and time. She has normal reflexes.  Skin: Skin is warm and dry.  Psychiatric: She has a normal mood and affect. Her behavior is normal. Judgment and thought content normal.    Vital signs in last 24 hours: @VSRANGES @  Labs:   Estimated body mass index is 24.07 kg/m as calculated from the following:   Height as of 07/22/17: 5\' 2"  (1.575 m).   Weight as of 07/22/17: 59.7 kg (131 lb 9.6 oz).   Imaging Review Plain radiographs demonstrate severe degenerative joint disease of the left knee(s). The overall alignment issignificant varus. The bone quality appears to be adequate for age and reported activity level.   Preoperative templating of the joint replacement has been completed, documented, and submitted to the Operating Room personnel in order to optimize intra-operative equipment management.    Patient's anticipated  LOS is less than 2 midnights, meeting these requirements: - Younger than 53 - Lives within 1 hour of care - Has a competent adult at home to recover with post-op recover - NO history of  - Chronic pain requiring opiods  - Diabetes  - Coronary Artery Disease  - Heart failure  - Heart attack  - Stroke  - DVT/VTE  - Cardiac arrhythmia  - Respiratory Failure/COPD  - Renal failure  - Anemia  - Advanced Liver disease        Assessment/Plan:  End stage arthritis, left knee   The patient history, physical examination, clinical judgment of the provider and imaging studies are consistent with end stage degenerative joint disease of the left knee(s) and total knee arthroplasty is deemed medically necessary. The treatment options including medical management, injection therapy arthroscopy and arthroplasty were discussed at length. The risks and benefits of total knee arthroplasty were presented and reviewed. The risks due to aseptic loosening, infection, stiffness, patella tracking  problems, thromboembolic complications and other imponderables were discussed. The patient acknowledged the explanation, agreed to proceed with the plan and consent was signed. Patient is being admitted for inpatient treatment for surgery, pain control, PT, OT, prophylactic antibiotics, VTE prophylaxis, progressive ambulation and ADL's and discharge planning. The patient is planning to be discharged home with outpatient PT at [facility]. Rx for DME given.

## 2017-07-24 NOTE — H&P (View-Only) (Signed)
TOTAL KNEE ADMISSION H&P  Patient is being admitted for left total knee arthroplasty.  Subjective:  Chief Complaint:left knee pain.  HPI: [name], 67 y.o. female, has a history of pain and functional disability in the left knee due to arthritis and has failed non-surgical conservative treatments for greater than 12 weeks to includeNSAID's and/or analgesics, corticosteriod injections, viscosupplementation injections, flexibility and strengthening excercises, use of assistive devices, weight reduction as appropriate and activity modification.  Onset of symptoms was gradual, starting 3 years ago with gradually worsening course since that time. The patient noted prior procedures on the knee to include  arthroscopy and menisectomy on the left knee(s).  Patient currently rates pain in the left knee(s) at 10 out of 10 with activity. Patient has night pain, worsening of pain with activity and weight bearing, pain that interferes with activities of daily living, pain with passive range of motion and joint swelling.  Patient has evidence of subchondral cysts, subchondral sclerosis, periarticular osteophytes and joint space narrowing by imaging studies. There is no active infection.  Patient Active Problem List   Diagnosis Date Noted  . Allergic rhinoconjunctivitis 11/16/2014  . Ventricular septal defect (VSD), membranous 10/22/2012  . HYPOTHYROIDISM 09/03/2007   Past Medical History:  Diagnosis Date  . Chronic constipation   . Diverticulosis   . Hypothyroidism   . 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 Ventricular septal defect (VSD), membranous    Documented by echocardiogram 2011    Past Surgical History:  Procedure Laterality Date  . ABDOMINAL HYSTERECTOMY    . BREAST EXCISIONAL BIOPSY Right 1996  . BREAST LUMPECTOMY Right    benign  . COLONOSCOPY   02/03/2003   RMR: Normal rectum and colon  . COLONOSCOPY N/A 03/10/2013   Dr. Gala Romney-  minimal internal hemorrhoids/  anal papilla o/w normal appearing rectum. colonic diverticulosis  . TONSILLECTOMY      Current Outpatient Medications  Medication Sig Dispense Refill Last Dose  . ALPRAZolam (XANAX) 0.25 MG tablet Take 0.25 mg by mouth at bedtime as needed. May take one tablet once every month or two   Taking  . Ascorbic Acid (VITAMIN C) 1000 MG tablet Take 1,000 mg by mouth daily.   Taking  . Calcium Citrate-Vitamin D (CALCIUM + D PO) Take 1,200 mg by mouth daily.   Taking  . Cyanocobalamin (VITAMIN B-12) 1000 MCG SUBL Place 1 tablet under the tongue daily.   Taking  . desonide (DESOWEN) 0.05 % cream Apply 1 application topically as needed. ONCE DAILY IF NEEDED  3 Taking  . diphenhydrAMINE (BENADRYL) 25 MG tablet Take 25 mg by mouth 2 (two) times daily as needed. Patient alternates with Xyzal   Taking  . EPINEPHrine (EPIPEN 2-PAK) 0.3 mg/0.3 mL IJ SOAJ injection Inject 0.3 mg into the muscle once. Reported on 01/25/2015   Taking  . fish oil-omega-3 fatty acids 1000 MG capsule Take 1 g by mouth daily.    Taking  . levocetirizine (XYZAL) 5 MG tablet TAKE 1 TABLET (5 MG TOTAL) BY MOUTH DAILY. 30 tablet 1 Taking  . levothyroxine (SYNTHROID, LEVOTHROID) 75 MCG tablet Take 75 mcg by mouth daily.    Taking  . Melatonin 10 MG TABS Take 1 tablet by mouth as needed.   Taking  . montelukast (SINGULAIR) 10 MG tablet TAKE 1 TABLET (10 MG TOTAL) BY MOUTH AT BEDTIME. 30 tablet 0 Taking  . Multiple Vitamin (MULTIVITAMIN) capsule Take 1 capsule by mouth daily.   Taking  .  NON FORMULARY Take 1 tablet by mouth daily. Tumeric   Taking  . polyethylene glycol powder (GLYCOLAX/MIRALAX) powder MIX AND TAKE ONE TO TWO CAPFULS NIGHTLY AS NEEDED. 1054 g 2 Taking  . Potassium 95 MG TABS Take 1 tablet by mouth daily as needed.   Taking  . PREMARIN 0.625 MG tablet Take 0.625 mg by mouth daily.    Taking  . Probiotic Product (PROBIOTIC DAILY PO) Take by mouth.   Taking  . TURMERIC PO Take 1 capsule by mouth 2 (two) times daily.   Taking   . vitamin E 400 UNIT capsule Take 400 Units by mouth daily.   Taking   No current facility-administered medications for this visit.    Allergies  Allergen Reactions  . Sulfa Antibiotics Rash    Social History   Tobacco Use  . Smoking status: Former Smoker    Packs/day: 0.50    Years: 5.00    Pack years: 2.50    Types: Cigarettes    Start date: 05/18/1963    Last attempt to quit: 10/23/1968    Years since quitting: 48.7  . Smokeless tobacco: Never Used  . Tobacco comment: smoked in her 57s  Substance Use Topics  . Alcohol use: Yes    Alcohol/week: 0.0 oz    Comment: Socially    Family History  Problem Relation Age of Onset  . Hypertension Father   . Heart attack Father   . Hypertension Sister   . Colon cancer Neg Hx      Review of Systems  Constitutional: Negative.   HENT: Negative.   Eyes: Negative.   Respiratory: Negative.   Cardiovascular: Negative.   Gastrointestinal: Negative.   Genitourinary: Negative.   Musculoskeletal: Positive for back pain and joint pain.  Skin: Positive for itching and rash.  Neurological: Positive for dizziness.  Endo/Heme/Allergies: Negative.   Psychiatric/Behavioral: Negative.     Objective:  Physical Exam  Vitals reviewed. Constitutional: She is oriented to person, place, and time. She appears well-developed and well-nourished.  HENT:  Head: Normocephalic and atraumatic.  Eyes: Pupils are equal, round, and reactive to light. Conjunctivae and EOM are normal.  Neck: Normal range of motion. Neck supple.  Cardiovascular: Normal rate, normal heart sounds and intact distal pulses.  Respiratory: Effort normal and breath sounds normal. No respiratory distress.  GI: Soft. Bowel sounds are normal. She exhibits no distension.  Genitourinary:  Genitourinary Comments: deferred  Musculoskeletal:       Left knee: She exhibits decreased range of motion, swelling and abnormal alignment. Tenderness found. Medial joint line and lateral joint  line tenderness noted.  Neurological: She is alert and oriented to person, place, and time. She has normal reflexes.  Skin: Skin is warm and dry.  Psychiatric: She has a normal mood and affect. Her behavior is normal. Judgment and thought content normal.    Vital signs in last 24 hours: @VSRANGES @  Labs:   Estimated body mass index is 24.07 kg/m as calculated from the following:   Height as of 07/22/17: 5\' 2"  (1.575 m).   Weight as of 07/22/17: 59.7 kg (131 lb 9.6 oz).   Imaging Review Plain radiographs demonstrate severe degenerative joint disease of the left knee(s). The overall alignment issignificant varus. The bone quality appears to be adequate for age and reported activity level.   Preoperative templating of the joint replacement has been completed, documented, and submitted to the Operating Room personnel in order to optimize intra-operative equipment management.    Patient's anticipated  LOS is less than 2 midnights, meeting these requirements: - Younger than 94 - Lives within 1 hour of care - Has a competent adult at home to recover with post-op recover - NO history of  - Chronic pain requiring opiods  - Diabetes  - Coronary Artery Disease  - Heart failure  - Heart attack  - Stroke  - DVT/VTE  - Cardiac arrhythmia  - Respiratory Failure/COPD  - Renal failure  - Anemia  - Advanced Liver disease        Assessment/Plan:  End stage arthritis, left knee   The patient history, physical examination, clinical judgment of the provider and imaging studies are consistent with end stage degenerative joint disease of the left knee(s) and total knee arthroplasty is deemed medically necessary. The treatment options including medical management, injection therapy arthroscopy and arthroplasty were discussed at length. The risks and benefits of total knee arthroplasty were presented and reviewed. The risks due to aseptic loosening, infection, stiffness, patella tracking  problems, thromboembolic complications and other imponderables were discussed. The patient acknowledged the explanation, agreed to proceed with the plan and consent was signed. Patient is being admitted for inpatient treatment for surgery, pain control, PT, OT, prophylactic antibiotics, VTE prophylaxis, progressive ambulation and ADL's and discharge planning. The patient is planning to be discharged home with outpatient PT at [facility]. Rx for DME given.

## 2017-07-25 ENCOUNTER — Ambulatory Visit (HOSPITAL_COMMUNITY)
Admission: RE | Admit: 2017-07-25 | Discharge: 2017-07-25 | Disposition: A | Discharge status: Home / Self Care | Payer: Medicare Other | Source: Ambulatory Visit | Attending: Cardiology | Admitting: Cardiology

## 2017-07-25 DIAGNOSIS — I081 Rheumatic disorders of both mitral and tricuspid valves: Secondary | ICD-10-CM | POA: Insufficient documentation

## 2017-07-25 DIAGNOSIS — Q210 Ventricular septal defect: Secondary | ICD-10-CM

## 2017-07-25 DIAGNOSIS — I340 Nonrheumatic mitral (valve) insufficiency: Secondary | ICD-10-CM

## 2017-07-25 HISTORY — DX: Nonrheumatic mitral (valve) insufficiency: I34.0

## 2017-07-25 NOTE — Progress Notes (Signed)
*  PRELIMINARY RESULTS* Echocardiogram 2D Echocardiogram has been performed.  [name] 07/25/2017, 3:48 PM

## 2017-07-26 ENCOUNTER — Telehealth: Payer: Self-pay | Admitting: *Deleted

## 2017-07-26 NOTE — Telephone Encounter (Signed)
Patient informed. Copy sent to PCP °

## 2017-07-26 NOTE — Telephone Encounter (Signed)
-----   Message from Satira Sark, MD sent at 07/25/2017  5:24 PM EDT ----- Results reviewed.  LVEF remains normal range. Overall stable membranous VSD with no obvious right ventricular compromise.  PA pressures actually measure in normal range at this time.  Also moderate mitral regurgitation which we will follow.  Continue with current plan. A copy of this test should be forwarded to Rory Percy, MD.

## 2017-07-30 ENCOUNTER — Encounter (HOSPITAL_COMMUNITY): Payer: Self-pay

## 2017-07-30 DIAGNOSIS — H40033 Anatomical narrow angle, bilateral: Secondary | ICD-10-CM | POA: Diagnosis not present

## 2017-07-30 NOTE — Patient Instructions (Addendum)
Your procedure is scheduled on: Thursday, August 08, 2017   Surgery Time:  10:30AM-1:00PM   Report to [facility] Main  Entrance    Report to admitting at  8:00 AM   Call this number if you have problems the morning of surgery [phone]   Do not eat food or drink liquids :After Midnight.   Do NOT smoke after Midnight   Take these medicines the morning of surgery with A SIP OF WATER: Levothyroxine                               You may not have any metal on your body including hair pins, jewelry, and body piercings             Do not wear make-up, lotions, powders, perfumes/cologne, or deodorant             Do not wear nail polish.  Do not shave  48 hours prior to surgery.                 Do not bring valuables to the hospital. [place].   Contacts, dentures or bridgework may not be worn into surgery.   Leave suitcase in the car. After surgery it may be brought to your room.   Special Instructions:            Please read over the following fact sheets you were given:  [facility] - Preparing for Surgery Before surgery, you can play an important role.  Because skin is not sterile, your skin needs to be as free of germs as possible.  You can reduce the number of germs on your skin by washing with CHG (chlorahexidine gluconate) soap before surgery.  CHG is an antiseptic cleaner which kills germs and bonds with the skin to continue killing germs even after washing. Please DO NOT use if you have an allergy to CHG or antibacterial soaps.  If your skin becomes reddened/irritated stop using the CHG and inform your nurse when you arrive at Short Stay. Do not shave (including legs and underarms) for at least 48 hours prior to the first CHG shower.  You may shave your face/neck.  Please follow these instructions carefully:  1.  Shower with CHG Soap the night before surgery and the  morning of surgery.  2.  If you choose to  wash your hair, wash your hair first as usual with your normal  shampoo.  3.  After you shampoo, rinse your hair and body thoroughly to remove the shampoo.                             4.  Use CHG as you would any other liquid soap.  You can apply chg directly to the skin and wash.  Gently with a scrungie or clean washcloth.  5.  Apply the CHG Soap to your body ONLY FROM THE NECK DOWN.   Do   not use on face/ open                           Wound or open sores. Avoid contact with eyes, ears mouth and   genitals (private parts).  Wash face,  Genitals (private parts) with your normal soap.             6.  Wash thoroughly, paying special attention to the area where your surgery  will be performed.  7.  Thoroughly rinse your body with warm water from the neck down.  8.  DO NOT shower/wash with your normal soap after using and rinsing off the CHG Soap.                9.  Pat yourself dry with a clean towel.            10.  Wear clean pajamas.            11.  Place clean sheets on your bed the night of your first shower and do not  sleep with pets. Day of Surgery : Do not apply any lotions/deodorants the morning of surgery.  Please wear clean clothes to the hospital/surgery center.  FAILURE TO FOLLOW THESE INSTRUCTIONS MAY RESULT IN THE CANCELLATION OF YOUR SURGERY  PATIENT SIGNATURE_________________________________  NURSE SIGNATURE__________________________________  ________________________________________________________________________   [name]  An incentive spirometer is a tool that can help keep your lungs clear and active. This tool measures how well you are filling your lungs with each breath. Taking long deep breaths may help reverse or decrease the chance of developing breathing (pulmonary) problems (especially infection) following:  A long period of time when you are unable to move or be active. BEFORE THE PROCEDURE   If the spirometer includes an  indicator to show your best effort, your nurse or respiratory therapist will set it to a desired goal.  If possible, sit up straight or lean slightly forward. Try not to slouch.  Hold the incentive spirometer in an upright position. INSTRUCTIONS FOR USE  1. Sit on the edge of your bed if possible, or sit up as far as you can in bed or on a chair. 2. Hold the incentive spirometer in an upright position. 3. Breathe out normally. 4. Place the mouthpiece in your mouth and seal your lips tightly around it. 5. Breathe in slowly and as deeply as possible, raising the piston or the ball toward the top of the column. 6. Hold your breath for 3-5 seconds or for as long as possible. Allow the piston or ball to fall to the bottom of the column. 7. Remove the mouthpiece from your mouth and breathe out normally. 8. Rest for a few seconds and repeat Steps 1 through 7 at least 10 times every 1-2 hours when you are awake. Take your time and take a few normal breaths between deep breaths. 9. The spirometer may include an indicator to show your best effort. Use the indicator as a goal to work toward during each repetition. 10. After each set of 10 deep breaths, practice coughing to be sure your lungs are clear. If you have an incision (the cut made at the time of surgery), support your incision when coughing by placing a pillow or rolled up towels firmly against it. Once you are able to get out of bed, walk around indoors and cough well. You may stop using the incentive spirometer when instructed by your caregiver.  RISKS AND COMPLICATIONS  Take your time so you do not get dizzy or light-headed.  If you are in pain, you may need to take or ask for pain medication before doing incentive spirometry. It is harder to take a deep breath if you are having pain.  AFTER USE  Rest and breathe slowly and easily.  It can be helpful to keep track of a log of your progress. Your caregiver can provide you with a simple table  to help with this. If you are using the spirometer at home, follow these instructions: [place] IF:   You are having difficultly using the spirometer.  You have trouble using the spirometer as often as instructed.  Your pain medication is not giving enough relief while using the spirometer.  You develop fever of 100.5 F (38.1 C) or higher. SEEK IMMEDIATE MEDICAL CARE IF:   You cough up bloody sputum that had not been present before.  You develop fever of 102 F (38.9 C) or greater.  You develop worsening pain at or near the incision site. MAKE SURE YOU:   Understand these instructions.  Will watch your condition.  Will get help right away if you are not doing well or get worse. Document Released: 05/28/2006 Document Revised: 04/09/2011 Document Reviewed: 07/29/2006 Exit[employer] Patient Information 2014 Exit[employer], [place].   ________________________________________________________________________  WHAT IS A BLOOD TRANSFUSION? Blood Transfusion Information  A transfusion is the replacement of blood or some of its parts. Blood is made up of multiple cells which provide different functions.  Red blood cells carry oxygen and are used for blood loss replacement.  White blood cells fight against infection.  Platelets control bleeding.  Plasma helps clot blood.  Other blood products are available for specialized needs, such as hemophilia or other clotting disorders. BEFORE THE TRANSFUSION  Who gives blood for transfusions?   Healthy volunteers who are fully evaluated to make sure their blood is safe. This is blood bank blood. Transfusion therapy is the safest it has ever been in the practice of medicine. Before blood is taken from a donor, a complete history is taken to make sure that person has no history of diseases nor engages in risky social behavior (examples are intravenous drug use or sexual activity with multiple partners). The donor's travel history is screened  to minimize risk of transmitting infections, such as malaria. The donated blood is tested for signs of infectious diseases, such as HIV and hepatitis. The blood is then tested to be sure it is compatible with you in order to minimize the chance of a transfusion reaction. If you or a relative donates blood, this is often done in anticipation of surgery and is not appropriate for emergency situations. It takes many days to process the donated blood. RISKS AND COMPLICATIONS Although transfusion therapy is very safe and saves many lives, the main dangers of transfusion include:   Getting an infectious disease.  Developing a transfusion reaction. This is an allergic reaction to something in the blood you were given. Every precaution is taken to prevent this. The decision to have a blood transfusion has been considered carefully by your caregiver before blood is given. Blood is not given unless the benefits outweigh the risks. AFTER THE TRANSFUSION  Right after receiving a blood transfusion, you will usually feel much better and more energetic. This is especially true if your red blood cells have gotten low (anemic). The transfusion raises the level of the red blood cells which carry oxygen, and this usually causes an energy increase.  The nurse administering the transfusion will monitor you carefully for complications. HOME CARE INSTRUCTIONS  No special instructions are needed after a transfusion. You may find your energy is better. Speak with your caregiver about any limitations on activity for underlying diseases  you may have. SEEK MEDICAL CARE IF:   Your condition is not improving after your transfusion.  You develop redness or irritation at the intravenous (IV) site. SEEK IMMEDIATE MEDICAL CARE IF:  Any of the following symptoms occur over the next 12 hours:  Shaking chills.  You have a temperature by mouth above 102 F (38.9 C), not controlled by medicine.  Chest, back, or muscle  pain.  People around you feel you are not acting correctly or are confused.  Shortness of breath or difficulty breathing.  Dizziness and fainting.  You get a rash or develop hives.  You have a decrease in urine output.  Your urine turns a dark color or changes to pink, red, or brown. Any of the following symptoms occur over the next 10 days:  You have a temperature by mouth above 102 F (38.9 C), not controlled by medicine.  Shortness of breath.  Weakness after normal activity.  The white part of the eye turns yellow (jaundice).  You have a decrease in the amount of urine or are urinating less often.  Your urine turns a dark color or changes to pink, red, or brown. Document Released: 01/13/2000 Document Revised: 04/09/2011 Document Reviewed: 09/01/2007 [facility] Patient Information 2014 [place], [place].  _______________________________________________________________________

## 2017-07-30 NOTE — Pre-Procedure Instructions (Signed)
The following are in epic: Last office visit and note Dr. Domenic Polite 07/22/2017 EKG 07/22/2017 ECHO 07/25/2017   The following are in the chart: Surgical clearance Dr. Nadara Mustard 07/15/2017 Cardiac Clearance Dr. Domenic Polite 07/22/2017

## 2017-07-31 ENCOUNTER — Other Ambulatory Visit: Payer: Self-pay

## 2017-07-31 ENCOUNTER — Encounter (HOSPITAL_COMMUNITY): Payer: Self-pay

## 2017-07-31 ENCOUNTER — Encounter (HOSPITAL_COMMUNITY)
Admission: RE | Admit: 2017-07-31 | Discharge: 2017-07-31 | Disposition: A | Discharge status: Home / Self Care | Payer: Medicare Other | Source: Ambulatory Visit | Attending: Orthopedic Surgery | Admitting: Orthopedic Surgery

## 2017-07-31 DIAGNOSIS — Z01812 Encounter for preprocedural laboratory examination: Secondary | ICD-10-CM | POA: Diagnosis not present

## 2017-07-31 DIAGNOSIS — M1712 Unilateral primary osteoarthritis, left knee: Secondary | ICD-10-CM | POA: Insufficient documentation

## 2017-07-31 HISTORY — DX: Tinnitus, unspecified ear: H93.19

## 2017-07-31 HISTORY — DX: Unspecified osteoarthritis, unspecified site: M19.90

## 2017-07-31 HISTORY — DX: Benign neoplasm of peripheral nerves and autonomic nervous system of lower limb, including hip: D36.13

## 2017-07-31 HISTORY — DX: Other seasonal allergic rhinitis: J30.2

## 2017-07-31 HISTORY — DX: Personal history of urinary calculi: Z87.442

## 2017-07-31 HISTORY — DX: Bunion of unspecified foot: M21.619

## 2017-07-31 HISTORY — DX: Nonrheumatic mitral (valve) insufficiency: I34.0

## 2017-07-31 LAB — BASIC METABOLIC PANEL
ANION GAP: 6 (ref 5–15)
BUN: 18 mg/dL (ref 8–23)
CALCIUM: 9.6 mg/dL (ref 8.9–10.3)
CO2: 30 mmol/L (ref 22–32)
Chloride: 105 mmol/L (ref 98–111)
Creatinine, Ser: 0.68 mg/dL (ref 0.44–1.00)
Glucose, Bld: 95 mg/dL (ref 70–99)
POTASSIUM: 4.4 mmol/L (ref 3.5–5.1)
SODIUM: 141 mmol/L (ref 135–145)

## 2017-07-31 LAB — SURGICAL PCR SCREEN
MRSA, PCR: NEGATIVE
STAPHYLOCOCCUS AUREUS: NEGATIVE

## 2017-07-31 LAB — CBC
HEMATOCRIT: 41.1 % (ref 36.0–46.0)
HEMOGLOBIN: 13.5 g/dL (ref 12.0–15.0)
MCH: 33.2 pg (ref 26.0–34.0)
MCHC: 32.8 g/dL (ref 30.0–36.0)
MCV: 101 fL — ABNORMAL HIGH (ref 78.0–100.0)
Platelets: 220 10*3/uL (ref 150–400)
RBC: 4.07 MIL/uL (ref 3.87–5.11)
RDW: 12.1 % (ref 11.5–15.5)
WBC: 6.4 10*3/uL (ref 4.0–10.5)

## 2017-07-31 LAB — ABO/RH: ABO/RH(D): A POS

## 2017-08-05 DIAGNOSIS — M9903 Segmental and somatic dysfunction of lumbar region: Secondary | ICD-10-CM | POA: Diagnosis not present

## 2017-08-05 DIAGNOSIS — S338XXA Sprain of other parts of lumbar spine and pelvis, initial encounter: Secondary | ICD-10-CM | POA: Diagnosis not present

## 2017-08-07 ENCOUNTER — Other Ambulatory Visit: Payer: Self-pay | Admitting: Orthopedic Surgery

## 2017-08-07 MED ORDER — TRANEXAMIC ACID 1000 MG/10ML IV SOLN
1000.0000 mg | INTRAVENOUS | Status: AC
  Administered 2017-08-08: 1000 mg via INTRAVENOUS
  Filled 2017-08-07: qty 1100

## 2017-08-07 NOTE — Care Plan (Signed)
Ortho Bundle L TKA scheduled on 08-08-17 DCP:  Home with sister.  2 story home with 2 ste. DME:  No needs.  Rx for RW provided prior to surgery and has a 3-in-1. PT:  [facility] [place].  PT eval scheduled on 08-13-17.

## 2017-08-08 ENCOUNTER — Encounter (HOSPITAL_COMMUNITY)
Admission: RE | Disposition: A | Discharge status: Home / Self Care | Payer: Self-pay | Source: Ambulatory Visit | Attending: Orthopedic Surgery

## 2017-08-08 ENCOUNTER — Observation Stay (HOSPITAL_COMMUNITY): Payer: Medicare Other

## 2017-08-08 ENCOUNTER — Inpatient Hospital Stay (HOSPITAL_COMMUNITY): Payer: Medicare Other | Admitting: Certified Registered Nurse Anesthetist

## 2017-08-08 ENCOUNTER — Encounter (HOSPITAL_COMMUNITY): Payer: Self-pay | Admitting: *Deleted

## 2017-08-08 ENCOUNTER — Ambulatory Visit: Payer: Medicare Other | Admitting: Cardiology

## 2017-08-08 ENCOUNTER — Other Ambulatory Visit: Payer: Self-pay

## 2017-08-08 ENCOUNTER — Observation Stay (HOSPITAL_COMMUNITY)
Admission: RE | Admit: 2017-08-08 | Discharge: 2017-08-09 | Disposition: A | Discharge status: Home / Self Care | Payer: Medicare Other | Source: Ambulatory Visit | Attending: Orthopedic Surgery | Admitting: Orthopedic Surgery

## 2017-08-08 DIAGNOSIS — Z96652 Presence of left artificial knee joint: Secondary | ICD-10-CM | POA: Diagnosis not present

## 2017-08-08 DIAGNOSIS — K5909 Other constipation: Secondary | ICD-10-CM | POA: Diagnosis not present

## 2017-08-08 DIAGNOSIS — Z87891 Personal history of nicotine dependence: Secondary | ICD-10-CM | POA: Diagnosis not present

## 2017-08-08 DIAGNOSIS — Q210 Ventricular septal defect: Secondary | ICD-10-CM | POA: Diagnosis not present

## 2017-08-08 DIAGNOSIS — Z79890 Hormone replacement therapy (postmenopausal): Secondary | ICD-10-CM | POA: Insufficient documentation

## 2017-08-08 DIAGNOSIS — Z79899 Other long term (current) drug therapy: Secondary | ICD-10-CM | POA: Diagnosis not present

## 2017-08-08 DIAGNOSIS — G8918 Other acute postprocedural pain: Secondary | ICD-10-CM | POA: Diagnosis not present

## 2017-08-08 DIAGNOSIS — M25562 Pain in left knee: Secondary | ICD-10-CM | POA: Diagnosis present

## 2017-08-08 DIAGNOSIS — M1712 Unilateral primary osteoarthritis, left knee: Principal | ICD-10-CM | POA: Insufficient documentation

## 2017-08-08 DIAGNOSIS — E039 Hypothyroidism, unspecified: Secondary | ICD-10-CM | POA: Insufficient documentation

## 2017-08-08 DIAGNOSIS — Z471 Aftercare following joint replacement surgery: Secondary | ICD-10-CM | POA: Diagnosis not present

## 2017-08-08 HISTORY — PX: KNEE ARTHROPLASTY: SHX992

## 2017-08-08 LAB — TYPE AND SCREEN
ABO/RH(D): A POS
Antibody Screen: NEGATIVE

## 2017-08-08 SURGERY — ARTHROPLASTY, KNEE, TOTAL, USING IMAGELESS COMPUTER-ASSISTED NAVIGATION
Anesthesia: Spinal | Site: Knee | Laterality: Left

## 2017-08-08 MED ORDER — ONDANSETRON HCL 4 MG PO TABS: 4.0000 mg | ORAL_TABLET | Freq: Four times a day (QID) | ORAL | Status: DC | PRN

## 2017-08-08 MED ORDER — PHENYLEPHRINE 40 MCG/ML (10ML) SYRINGE FOR IV PUSH (FOR BLOOD PRESSURE SUPPORT)
PREFILLED_SYRINGE | INTRAVENOUS | Status: AC
  Filled 2017-08-08: qty 20

## 2017-08-08 MED ORDER — CHLORHEXIDINE GLUCONATE 4 % EX LIQD: 60.0000 mL | Freq: Once | CUTANEOUS | Status: DC

## 2017-08-08 MED ORDER — PHENYLEPHRINE HCL 10 MG/ML IJ SOLN
INTRAMUSCULAR | Status: DC | PRN
  Administered 2017-08-08 (×5): 80 ug via INTRAVENOUS
  Administered 2017-08-08: 40 ug via INTRAVENOUS

## 2017-08-08 MED ORDER — ONDANSETRON HCL 4 MG/2ML IJ SOLN: 4.0000 mg | Freq: Four times a day (QID) | INTRAMUSCULAR | Status: DC | PRN

## 2017-08-08 MED ORDER — MORPHINE SULFATE (PF) 2 MG/ML IV SOLN
0.5000 mg | INTRAVENOUS | Status: DC | PRN
  Administered 2017-08-08: 0.5 mg via INTRAVENOUS
  Filled 2017-08-08: qty 1

## 2017-08-08 MED ORDER — BUPIVACAINE IN DEXTROSE 0.75-8.25 % IT SOLN
INTRATHECAL | Status: DC | PRN
  Administered 2017-08-08: 1.5 mL via INTRATHECAL

## 2017-08-08 MED ORDER — CEFAZOLIN SODIUM-DEXTROSE 2-4 GM/100ML-% IV SOLN
INTRAVENOUS | Status: AC
  Filled 2017-08-08: qty 100

## 2017-08-08 MED ORDER — EPHEDRINE SULFATE 50 MG/ML IJ SOLN
INTRAMUSCULAR | Status: DC | PRN
  Administered 2017-08-08 (×2): 10 mg via INTRAVENOUS
  Administered 2017-08-08: 5 mg via INTRAVENOUS

## 2017-08-08 MED ORDER — METOCLOPRAMIDE HCL 5 MG/ML IJ SOLN: 5.0000 mg | Freq: Three times a day (TID) | INTRAMUSCULAR | Status: DC | PRN

## 2017-08-08 MED ORDER — FENTANYL CITRATE (PF) 100 MCG/2ML IJ SOLN
50.0000 ug | INTRAMUSCULAR | Status: AC
  Administered 2017-08-08: 50 ug via INTRAVENOUS
  Filled 2017-08-08: qty 2

## 2017-08-08 MED ORDER — ACETAMINOPHEN 10 MG/ML IV SOLN
1000.0000 mg | INTRAVENOUS | Status: AC
  Administered 2017-08-08: 1000 mg via INTRAVENOUS
  Filled 2017-08-08: qty 100

## 2017-08-08 MED ORDER — MIDAZOLAM HCL 2 MG/2ML IJ SOLN
INTRAMUSCULAR | Status: AC
  Filled 2017-08-08: qty 2

## 2017-08-08 MED ORDER — SODIUM CHLORIDE 0.9 % IR SOLN
Status: DC | PRN
  Administered 2017-08-08: 3000 mL

## 2017-08-08 MED ORDER — POVIDONE-IODINE 10 % EX SWAB: 2.0000 "application " | Freq: Once | CUTANEOUS | Status: DC

## 2017-08-08 MED ORDER — SENNA 8.6 MG PO TABS
1.0000 | ORAL_TABLET | Freq: Two times a day (BID) | ORAL | Status: DC
  Administered 2017-08-08 – 2017-08-09 (×2): 8.6 mg via ORAL
  Filled 2017-08-08 (×2): qty 1

## 2017-08-08 MED ORDER — DEXAMETHASONE SODIUM PHOSPHATE 10 MG/ML IJ SOLN
INTRAMUSCULAR | Status: AC
  Filled 2017-08-08: qty 1

## 2017-08-08 MED ORDER — ALUM & MAG HYDROXIDE-SIMETH 200-200-20 MG/5ML PO SUSP: 30.0000 mL | ORAL | Status: DC | PRN

## 2017-08-08 MED ORDER — MEPERIDINE HCL 50 MG/ML IJ SOLN: 6.2500 mg | INTRAMUSCULAR | Status: DC | PRN

## 2017-08-08 MED ORDER — DOCUSATE SODIUM 100 MG PO CAPS
100.0000 mg | ORAL_CAPSULE | Freq: Two times a day (BID) | ORAL | Status: DC
  Administered 2017-08-08 – 2017-08-09 (×2): 100 mg via ORAL
  Filled 2017-08-08 (×2): qty 1

## 2017-08-08 MED ORDER — BUPIVACAINE-EPINEPHRINE 0.25% -1:200000 IJ SOLN
INTRAMUSCULAR | Status: DC | PRN
  Administered 2017-08-08: 30 mL

## 2017-08-08 MED ORDER — ASPIRIN 81 MG PO CHEW
81.0000 mg | CHEWABLE_TABLET | Freq: Two times a day (BID) | ORAL | Status: DC
  Administered 2017-08-08 – 2017-08-09 (×2): 81 mg via ORAL
  Filled 2017-08-08 (×2): qty 1

## 2017-08-08 MED ORDER — BUPIVACAINE-EPINEPHRINE (PF) 0.25% -1:200000 IJ SOLN
INTRAMUSCULAR | Status: AC
  Filled 2017-08-08: qty 30

## 2017-08-08 MED ORDER — SODIUM CHLORIDE 0.9 % IR SOLN
Status: DC | PRN
  Administered 2017-08-08: 1000 mL

## 2017-08-08 MED ORDER — SODIUM CHLORIDE 0.9 % IV SOLN
INTRAVENOUS | Status: DC
  Administered 2017-08-08: 18:00:00 via INTRAVENOUS

## 2017-08-08 MED ORDER — ONDANSETRON HCL 4 MG/2ML IJ SOLN
INTRAMUSCULAR | Status: AC
  Filled 2017-08-08: qty 2

## 2017-08-08 MED ORDER — KETOROLAC TROMETHAMINE 30 MG/ML IJ SOLN
INTRAMUSCULAR | Status: AC
  Filled 2017-08-08: qty 1

## 2017-08-08 MED ORDER — SODIUM CHLORIDE 0.9 % IJ SOLN
INTRAMUSCULAR | Status: DC | PRN
  Administered 2017-08-08: 30 mL

## 2017-08-08 MED ORDER — ZOLPIDEM TARTRATE 5 MG PO TABS: 5.0000 mg | ORAL_TABLET | Freq: Every evening | ORAL | Status: DC | PRN

## 2017-08-08 MED ORDER — DEXAMETHASONE SODIUM PHOSPHATE 10 MG/ML IJ SOLN
INTRAMUSCULAR | Status: DC | PRN
  Administered 2017-08-08: 10 mg via INTRAVENOUS

## 2017-08-08 MED ORDER — ROPIVACAINE HCL 5 MG/ML IJ SOLN
INTRAMUSCULAR | Status: DC | PRN
  Administered 2017-08-08: 30 mL

## 2017-08-08 MED ORDER — POLYETHYLENE GLYCOL 3350 17 G PO PACK: 17.0000 g | PACK | Freq: Every day | ORAL | Status: DC | PRN

## 2017-08-08 MED ORDER — PROPOFOL 500 MG/50ML IV EMUL
INTRAVENOUS | Status: DC | PRN
  Administered 2017-08-08: 75 ug/kg/min via INTRAVENOUS

## 2017-08-08 MED ORDER — FENTANYL CITRATE (PF) 100 MCG/2ML IJ SOLN: 25.0000 ug | INTRAMUSCULAR | Status: DC | PRN

## 2017-08-08 MED ORDER — 0.9 % SODIUM CHLORIDE (POUR BTL) OPTIME
TOPICAL | Status: DC | PRN
  Administered 2017-08-08: 1000 mL

## 2017-08-08 MED ORDER — CEFAZOLIN SODIUM-DEXTROSE 2-4 GM/100ML-% IV SOLN
2.0000 g | INTRAVENOUS | Status: AC
  Administered 2017-08-08: 2 g via INTRAVENOUS
  Filled 2017-08-08: qty 100

## 2017-08-08 MED ORDER — KETOROLAC TROMETHAMINE 15 MG/ML IJ SOLN
7.5000 mg | Freq: Four times a day (QID) | INTRAMUSCULAR | Status: DC
  Administered 2017-08-08 – 2017-08-09 (×3): 7.5 mg via INTRAVENOUS
  Filled 2017-08-08 (×3): qty 1

## 2017-08-08 MED ORDER — MONTELUKAST SODIUM 10 MG PO TABS
10.0000 mg | ORAL_TABLET | Freq: Every day | ORAL | Status: DC
  Administered 2017-08-08: 10 mg via ORAL
  Filled 2017-08-08: qty 1

## 2017-08-08 MED ORDER — MIDAZOLAM HCL 2 MG/2ML IJ SOLN
1.0000 mg | INTRAMUSCULAR | Status: AC
  Administered 2017-08-08: 1 mg via INTRAVENOUS
  Filled 2017-08-08: qty 2

## 2017-08-08 MED ORDER — EPHEDRINE 5 MG/ML INJ
INTRAVENOUS | Status: AC
  Filled 2017-08-08: qty 10

## 2017-08-08 MED ORDER — LEVOCETIRIZINE DIHYDROCHLORIDE 5 MG PO TABS: 5.0000 mg | ORAL_TABLET | Freq: Every evening | ORAL | Status: DC

## 2017-08-08 MED ORDER — KETOROLAC TROMETHAMINE 30 MG/ML IJ SOLN
INTRAMUSCULAR | Status: DC | PRN
  Administered 2017-08-08: 30 mg

## 2017-08-08 MED ORDER — ONDANSETRON HCL 4 MG/2ML IJ SOLN
INTRAMUSCULAR | Status: DC | PRN
  Administered 2017-08-08: 4 mg via INTRAVENOUS

## 2017-08-08 MED ORDER — ACETAMINOPHEN 325 MG PO TABS: 325.0000 mg | ORAL_TABLET | Freq: Four times a day (QID) | ORAL | Status: DC | PRN

## 2017-08-08 MED ORDER — SODIUM CHLORIDE 0.9 % IJ SOLN
INTRAMUSCULAR | Status: AC
  Filled 2017-08-08: qty 50

## 2017-08-08 MED ORDER — LACTATED RINGERS IV SOLN
INTRAVENOUS | Status: DC
  Administered 2017-08-08 (×2): via INTRAVENOUS

## 2017-08-08 MED ORDER — CEFAZOLIN SODIUM-DEXTROSE 2-4 GM/100ML-% IV SOLN
2.0000 g | Freq: Four times a day (QID) | INTRAVENOUS | Status: AC
Start: 2017-08-08 — End: 2017-08-09
  Administered 2017-08-08 – 2017-08-09 (×2): 2 g via INTRAVENOUS
  Filled 2017-08-08 (×2): qty 100

## 2017-08-08 MED ORDER — METOCLOPRAMIDE HCL 5 MG/ML IJ SOLN: 10.0000 mg | Freq: Once | INTRAMUSCULAR | Status: DC | PRN

## 2017-08-08 MED ORDER — PROPOFOL 10 MG/ML IV BOLUS
INTRAVENOUS | Status: AC
  Filled 2017-08-08: qty 40

## 2017-08-08 MED ORDER — DEXAMETHASONE SODIUM PHOSPHATE 10 MG/ML IJ SOLN
10.0000 mg | Freq: Once | INTRAMUSCULAR | Status: AC
  Administered 2017-08-09: 10 mg via INTRAVENOUS
  Filled 2017-08-08: qty 1

## 2017-08-08 MED ORDER — CLONIDINE HCL (ANALGESIA) 100 MCG/ML EP SOLN
EPIDURAL | Status: DC | PRN
  Administered 2017-08-08: 75 ug

## 2017-08-08 MED ORDER — STERILE WATER FOR IRRIGATION IR SOLN
Status: DC | PRN
  Administered 2017-08-08: 2000 mL

## 2017-08-08 MED ORDER — METOCLOPRAMIDE HCL 5 MG PO TABS: 5.0000 mg | ORAL_TABLET | Freq: Three times a day (TID) | ORAL | Status: DC | PRN

## 2017-08-08 MED ORDER — LORATADINE 10 MG PO TABS
10.0000 mg | ORAL_TABLET | Freq: Every evening | ORAL | Status: DC
  Administered 2017-08-08: 10 mg via ORAL
  Filled 2017-08-08: qty 1

## 2017-08-08 MED ORDER — ALPRAZOLAM 0.25 MG PO TABS: 0.2500 mg | ORAL_TABLET | Freq: Every evening | ORAL | Status: DC | PRN

## 2017-08-08 MED ORDER — HYDROCODONE-ACETAMINOPHEN 5-325 MG PO TABS
1.0000 | ORAL_TABLET | ORAL | Status: DC | PRN
  Administered 2017-08-08 – 2017-08-09 (×2): 1 via ORAL
  Filled 2017-08-08 (×2): qty 1

## 2017-08-08 MED ORDER — METHOCARBAMOL 1000 MG/10ML IJ SOLN
500.0000 mg | Freq: Four times a day (QID) | INTRAVENOUS | Status: DC | PRN
  Administered 2017-08-08: 500 mg via INTRAVENOUS
  Filled 2017-08-08: qty 550

## 2017-08-08 MED ORDER — HYDROCODONE-ACETAMINOPHEN 7.5-325 MG PO TABS
1.0000 | ORAL_TABLET | ORAL | Status: DC | PRN
  Administered 2017-08-08 – 2017-08-09 (×3): 1 via ORAL
  Filled 2017-08-08 (×3): qty 1

## 2017-08-08 MED ORDER — METHOCARBAMOL 500 MG PO TABS
500.0000 mg | ORAL_TABLET | Freq: Four times a day (QID) | ORAL | Status: DC | PRN
  Administered 2017-08-08 – 2017-08-09 (×2): 500 mg via ORAL
  Filled 2017-08-08 (×2): qty 1

## 2017-08-08 MED ORDER — FLUTICASONE PROPIONATE 50 MCG/ACT NA SUSP: 2.0000 | Freq: Two times a day (BID) | NASAL | Status: DC | PRN

## 2017-08-08 MED ORDER — FENTANYL CITRATE (PF) 100 MCG/2ML IJ SOLN
INTRAMUSCULAR | Status: AC
  Filled 2017-08-08: qty 2

## 2017-08-08 MED ORDER — PHENOL 1.4 % MT LIQD: 1.0000 | OROMUCOSAL | Status: DC | PRN

## 2017-08-08 MED ORDER — LEVOTHYROXINE SODIUM 75 MCG PO TABS
75.0000 ug | ORAL_TABLET | Freq: Every day | ORAL | Status: DC
  Administered 2017-08-09: 75 ug via ORAL
  Filled 2017-08-08: qty 1

## 2017-08-08 MED ORDER — DIPHENHYDRAMINE HCL 12.5 MG/5ML PO ELIX: 12.5000 mg | ORAL_SOLUTION | ORAL | Status: DC | PRN

## 2017-08-08 MED ORDER — MENTHOL 3 MG MT LOZG: 1.0000 | LOZENGE | OROMUCOSAL | Status: DC | PRN

## 2017-08-08 MED ORDER — SODIUM CHLORIDE 0.9 % IV SOLN: INTRAVENOUS | Status: DC

## 2017-08-08 SURGICAL SUPPLY — 75 items
ADH SKN CLS APL DERMABOND .7 (GAUZE/BANDAGES/DRESSINGS) ×1
BAG SPEC THK2 15X12 ZIP CLS (MISCELLANEOUS)
BAG ZIPLOCK 12X15 (MISCELLANEOUS) IMPLANT
BANDAGE ACE 4X5 VEL STRL LF (GAUZE/BANDAGES/DRESSINGS) ×3 IMPLANT
BANDAGE ACE 6X5 VEL STRL LF (GAUZE/BANDAGES/DRESSINGS) ×3 IMPLANT
BATTERY INSTRU NAVIGATION (MISCELLANEOUS) ×9 IMPLANT
BLADE SAW RECIPROCATING 77.5 (BLADE) ×3 IMPLANT
BSPLAT TIB 4 KN TRITANIUM (Knees) ×1 IMPLANT
BTRY SRG DRVR LF (MISCELLANEOUS) ×3
CHLORAPREP W/TINT 26ML (MISCELLANEOUS) ×6 IMPLANT
COVER SURGICAL LIGHT HANDLE (MISCELLANEOUS) ×3 IMPLANT
CUFF TOURN SGL QUICK 34 (TOURNIQUET CUFF) ×3
CUFF TRNQT CYL 34X4X40X1 (TOURNIQUET CUFF) ×1 IMPLANT
DECANTER SPIKE VIAL GLASS SM (MISCELLANEOUS) ×6 IMPLANT
DERMABOND ADVANCED (GAUZE/BANDAGES/DRESSINGS) ×2
DERMABOND ADVANCED .7 DNX12 (GAUZE/BANDAGES/DRESSINGS) ×2 IMPLANT
DRAPE SHEET LG 3/4 BI-LAMINATE (DRAPES) ×6 IMPLANT
DRAPE U-SHAPE 47X51 STRL (DRAPES) ×3 IMPLANT
DRSG AQUACEL AG ADV 3.5X10 (GAUZE/BANDAGES/DRESSINGS) ×3 IMPLANT
DRSG TEGADERM 4X4.75 (GAUZE/BANDAGES/DRESSINGS) IMPLANT
ELECT BLADE TIP CTD 4 INCH (ELECTRODE) ×3 IMPLANT
ELECT REM PT RETURN 15FT ADLT (MISCELLANEOUS) ×3 IMPLANT
EVACUATOR 1/8 PVC DRAIN (DRAIN) IMPLANT
FEMORAL RETAINING CRUC KNEE #4 (Knees) ×2 IMPLANT
GAUZE SPONGE 4X4 12PLY STRL (GAUZE/BANDAGES/DRESSINGS) ×3 IMPLANT
GLOVE BIO SURGEON STRL SZ8.5 (GLOVE) ×6 IMPLANT
GLOVE BIOGEL M STRL SZ7.5 (GLOVE) ×2 IMPLANT
GLOVE BIOGEL PI IND STRL 6.5 (GLOVE) IMPLANT
GLOVE BIOGEL PI IND STRL 7.0 (GLOVE) IMPLANT
GLOVE BIOGEL PI IND STRL 7.5 (GLOVE) IMPLANT
GLOVE BIOGEL PI IND STRL 8.5 (GLOVE) ×1 IMPLANT
GLOVE BIOGEL PI INDICATOR 6.5 (GLOVE) ×2
GLOVE BIOGEL PI INDICATOR 7.0 (GLOVE) ×8
GLOVE BIOGEL PI INDICATOR 7.5 (GLOVE) ×4
GLOVE BIOGEL PI INDICATOR 8.5 (GLOVE) ×2
GLOVE SURG SS PI 6.5 STRL IVOR (GLOVE) ×2 IMPLANT
GOWN SPEC L3 XXLG W/TWL (GOWN DISPOSABLE) ×5 IMPLANT
GOWN STRL REUS W/TWL LRG LVL3 (GOWN DISPOSABLE) ×4 IMPLANT
GOWN STRL REUS W/TWL XL LVL3 (GOWN DISPOSABLE) ×4 IMPLANT
HANDPIECE INTERPULSE COAX TIP (DISPOSABLE) ×3
HOOD PEEL AWAY FLYTE STAYCOOL (MISCELLANEOUS) ×9 IMPLANT
INSERT BEARNG TRI TIB SZ4 KNEE (Insert) IMPLANT
KNEE PATELLA ASYMMETRIC 10X32 (Knees) ×2 IMPLANT
KNEE TIBIAL COMP TRI SZ4 (Knees) ×2 IMPLANT
MARKER SKIN DUAL TIP RULER LAB (MISCELLANEOUS) ×3 IMPLANT
NDL SAFETY ECLIPSE 18X1.5 (NEEDLE) IMPLANT
NDL SPNL 18GX3.5 QUINCKE PK (NEEDLE) ×1 IMPLANT
NEEDLE HYPO 18GX1.5 SHARP (NEEDLE) ×3
NEEDLE SPNL 18GX3.5 QUINCKE PK (NEEDLE) ×3 IMPLANT
NS IRRIG 1000ML POUR BTL (IV SOLUTION) ×3 IMPLANT
PACK TOTAL KNEE CUSTOM (KITS) ×3 IMPLANT
PADDING CAST COTTON 6X4 STRL (CAST SUPPLIES) ×3 IMPLANT
POSITIONER SURGICAL ARM (MISCELLANEOUS) ×3 IMPLANT
SAW OSC TIP CART 19.5X105X1.3 (SAW) ×3 IMPLANT
SEALER BIPOLAR AQUA 6.0 (INSTRUMENTS) ×3 IMPLANT
SET HNDPC FAN SPRY TIP SCT (DISPOSABLE) ×1 IMPLANT
SET PAD KNEE POSITIONER (MISCELLANEOUS) ×3 IMPLANT
SPONGE DRAIN TRACH 4X4 STRL 2S (GAUZE/BANDAGES/DRESSINGS) IMPLANT
SPONGE LAP 18X18 RF (DISPOSABLE) IMPLANT
SUT MNCRL AB 3-0 PS2 18 (SUTURE) ×5 IMPLANT
SUT MON AB 2-0 CT1 36 (SUTURE) ×6 IMPLANT
SUT STRATAFIX PDO 1 14 VIOLET (SUTURE) ×3
SUT STRATFX PDO 1 14 VIOLET (SUTURE) ×1
SUT VIC AB 1 CT1 36 (SUTURE) ×9 IMPLANT
SUT VIC AB 2-0 CT1 27 (SUTURE) ×3
SUT VIC AB 2-0 CT1 TAPERPNT 27 (SUTURE) ×1 IMPLANT
SUTURE STRATFX PDO 1 14 VIOLET (SUTURE) ×1 IMPLANT
SYR 3ML LL SCALE MARK (SYRINGE) ×2 IMPLANT
SYR 50ML LL SCALE MARK (SYRINGE) ×3 IMPLANT
TOWER CARTRIDGE SMART MIX (DISPOSABLE) IMPLANT
TRAY FOLEY CATH 14FRSI W/METER (CATHETERS) ×2 IMPLANT
TRI TIB BEAR INSERT SZ4 KNEE (Insert) ×3 IMPLANT
WATER STERILE IRR 1000ML POUR (IV SOLUTION) ×6 IMPLANT
WRAP KNEE MAXI GEL POST OP (GAUZE/BANDAGES/DRESSINGS) ×3 IMPLANT
YANKAUER SUCT BULB TIP 10FT TU (MISCELLANEOUS) ×3 IMPLANT

## 2017-08-08 NOTE — Progress Notes (Signed)
3rd floor notified pt will be in 13-3 in 20 minutes

## 2017-08-08 NOTE — Evaluation (Signed)
Physical Therapy Evaluation Patient Details Name: [name] MRN: [id number] DOB: 31-May-1950 Today's Date: 08/08/2017   History of Present Illness  s/p L TKA  Clinical Impression  Pt is s/p TKA resulting in the deficits listed below (see PT Problem List). Doing well, amb `55' with RW and min assist, should be able to d/c tomorrow if pain controlled with mobility;  Pt will benefit from skilled PT to increase their independence and safety with mobility to allow discharge to the venue listed below.      Follow Up Recommendations Follow surgeon's recommendation for DC plan and follow-up therapies(OPPT)    Equipment Recommendations  None recommended by PT    Recommendations for Other Services       Precautions / Restrictions Precautions Precautions: Knee;Fall Restrictions Weight Bearing Restrictions: No Other Position/Activity Restrictions: WBAT      Mobility  Bed Mobility Overal bed mobility: Needs Assistance Bed Mobility: Supine to Sit     Supine to sit: Min guard     General bed mobility comments: for safety  Transfers Overall transfer level: Needs assistance Equipment used: Rolling walker (2 wheeled) Transfers: Sit to/from Stand Sit to Stand: Min assist         General transfer comment: cues for hand placement  Ambulation/Gait Ambulation/Gait assistance: Min guard;Min assist Gait Distance (Feet): 55 Feet Assistive device: Rolling walker (2 wheeled) Gait Pattern/deviations: Step-to pattern;Step-through pattern     General Gait Details: cues for RW position, safety, incr step length  Stairs            Wheelchair Mobility    Modified Rankin (Stroke Patients Only)       Balance                                             Pertinent Vitals/Pain Pain Assessment: 0-10 Pain Score: 4  Pain Location: L knee Pain Descriptors / Indicators: Sore Pain Intervention(s): Limited activity within patient's tolerance;Monitored during  session;Premedicated before session    Home Living Family/patient expects to be discharged to:: Private residence Living Arrangements: Alone Available Help at Discharge: Family Type of Home: House Home Access: Stairs to enter Entrance Stairs-Rails: None Entrance Stairs-Number of Steps: 2 Home Layout: Two level;Able to live on main level with bedroom/bathroom Home Equipment: Gilford Rile - 2 wheels;Bedside commode Additional Comments: sister assisting after surgery    Prior Function Level of Independence: Independent               Hand Dominance        Extremity/Trunk Assessment   Upper Extremity Assessment Upper Extremity Assessment: Overall WFL for tasks assessed    Lower Extremity Assessment Lower Extremity Assessment: LLE deficits/detail LLE Deficits / Details: hip flexion and knee extension 3/5; knee AAROM grossly 10* to 40*, limited by post op pain; ankle WFL       Communication   Communication: No difficulties  Cognition Arousal/Alertness: Awake/alert Behavior During Therapy: WFL for tasks assessed/performed Overall Cognitive Status: Within Functional Limits for tasks assessed                                        General Comments      Exercises Total Joint Exercises Ankle Circles/Pumps: AROM;Both;10 reps Quad Sets: AROM;Both;10 reps   Assessment/Plan    PT Assessment  Patient needs continued PT services  PT Problem List Decreased strength;Decreased range of motion;Decreased mobility;Decreased knowledge of use of DME;Pain       PT Treatment Interventions DME instruction;Gait training;Stair training;Functional mobility training;Therapeutic activities;Therapeutic exercise;Patient/family education    PT Goals (Current goals can be found in the Care Plan section)  Acute Rehab PT Goals Patient Stated Goal: less knee pain PT Goal Formulation: With patient Time For Goal Achievement: 08/15/17 Potential to Achieve Goals: Good    Frequency  7X/week   Barriers to discharge        Co-evaluation               AM-PAC PT "6 Clicks" Daily Activity  Outcome Measure Difficulty turning over in bed (including adjusting bedclothes, sheets and blankets)?: A Little Difficulty moving from lying on back to sitting on the side of the bed? : A Lot Difficulty sitting down on and standing up from a chair with arms (e.g., wheelchair, bedside commode, etc,.)?: Unable Help needed moving to and from a bed to chair (including a wheelchair)?: A Little Help needed walking in hospital room?: A Little Help needed climbing 3-5 steps with a railing? : A Lot 6 Click Score: 14    End of Session Equipment Utilized During Treatment: Gait belt Activity Tolerance: Patient tolerated treatment well Patient left: in chair;with call bell/phone within reach;with family/visitor present;with chair alarm set Nurse Communication: Mobility status PT Visit Diagnosis: Difficulty in walking, not elsewhere classified (R26.2)    Time: 9166-0600 PT Time Calculation (min) (ACUTE ONLY): 20 min   Charges:   PT Evaluation $PT Eval Low Complexity: 1 Low     PT G CodesKenyon Ana, PT Pager: [phone] 08/08/2017   [facility] 08/08/2017, 5:11 PM

## 2017-08-08 NOTE — Progress Notes (Signed)
Care Plan Notes 05/10/2017 to 08/08/2017       Care Plan by Leonides Grills A at 08/07/2017  3:17 PM    Date of Service   Author Author Type Status Note Type File Time  08/07/2017  Marianne Sofia  Signed Care Plan 08/07/2017             Ortho Bundle L TKA scheduled on 08-08-17 DCP:  Home with sister.  2 story home with 2 ste. DME:  No needs.  Rx for RW provided prior to surgery and has a 3-in-1. PT:  [facility] [place].  PT eval scheduled on 08-13-17.

## 2017-08-08 NOTE — Anesthesia Procedure Notes (Signed)
Procedure Name: MAC Date/Time: 08/08/2017 11:09 AM Performed by: West Pugh, CRNA Pre-anesthesia Checklist: Patient identified, Emergency Drugs available, Suction available, Patient being monitored and Timeout performed Patient Re-evaluated:Patient Re-evaluated prior to induction Oxygen Delivery Method: Nasal cannula Preoxygenation: Pre-oxygenation with 100% oxygen Induction Type: IV induction Placement Confirmation: positive ETCO2 and breath sounds checked- equal and bilateral Dental Injury: Teeth and Oropharynx as per pre-operative assessment

## 2017-08-08 NOTE — Discharge Instructions (Signed)
° °Dr. Carrell Palmatier °Total Joint Specialist °[facility] °[address] °[place], [place] [address] °[phone] ° °TOTAL KNEE REPLACEMENT POSTOPERATIVE DIRECTIONS ° ° ° °Knee Rehabilitation, Guidelines Following Surgery  °Results after knee surgery are often greatly improved when you follow the exercise, range of motion and muscle strengthening exercises prescribed by your doctor. Safety measures are also important to protect the knee from further injury. Any time any of these exercises cause you to have increased pain or swelling in your knee joint, decrease the amount until you are comfortable again and slowly increase them. If you have problems or questions, call your caregiver or physical therapist for advice.  ° °WEIGHT BEARING °Weight bearing as tolerated with assist device (walker, cane, etc) as directed, use it as long as suggested by your surgeon or therapist, typically at least 4-6 weeks. ° °HOME CARE INSTRUCTIONS  °Remove items at home which could result in a fall. This includes throw rugs or furniture in walking pathways.  °Continue medications as instructed at time of discharge. °You may have some home medications which will be placed on hold until you complete the course of blood thinner medication.  °You may start showering once you are discharged home but do not submerge the incision under water. Just pat the incision dry and apply a dry gauze dressing on daily. °Walk with walker as instructed.  °You may resume a sexual relationship in one month or when given the OK by your doctor.  °· Use walker as long as suggested by your caregivers. °· Avoid periods of inactivity such as sitting longer than an hour when not asleep. This helps prevent blood clots.  °You may put full weight on your legs and walk as much as is comfortable.  °You may return to work once you are cleared by your doctor.  °Do not drive a car for 6 weeks or until released by you surgeon.  °· Do not drive  while taking narcotics.  °Wear the elastic stockings for three weeks following surgery during the day but you may remove then at night. °Make sure you keep all of your appointments after your operation with all of your doctors and caregivers. You should call the office at the above phone number and make an appointment for approximately two weeks after the date of your surgery. °Do not remove your surgical dressing. The dressing is waterproof; you may take showers in 3 days, but do not take tub baths or submerge the dressing. °Please pick up a stool softener and laxative for home use as long as you are requiring pain medications. °· ICE to the affected knee every three hours for 30 minutes at a time and then as needed for pain and swelling.  Continue to use ice on the knee for pain and swelling from surgery. You may notice swelling that will progress down to the foot and ankle.  This is normal after surgery.  Elevate the leg when you are not up walking on it.   °It is important for you to complete the blood thinner medication as prescribed by your doctor. °· Continue to use the breathing machine which will help keep your temperature down.  It is common for your temperature to cycle up and down following surgery, especially at night when you are not up moving around and exerting yourself.  The breathing machine keeps your lungs expanded and your temperature down. ° °RANGE OF MOTION AND STRENGTHENING EXERCISES  °Rehabilitation of the knee is important following   a knee injury or an operation. After just a few days of immobilization, the muscles of the thigh which control the knee become weakened and shrink (atrophy). Knee exercises are designed to build up the tone and strength of the thigh muscles and to improve knee motion. Often times heat used for twenty to thirty minutes before working out will loosen up your tissues and help with improving the range of motion but do not use heat for the first two weeks following  surgery. These exercises can be done on a training (exercise) mat, on the floor, on a table or on a bed. Use what ever works the best and is most comfortable for you Knee exercises include:  °Leg Lifts - While your knee is still immobilized in a splint or cast, you can do straight leg raises. Lift the leg to 60 degrees, hold for 3 sec, and slowly lower the leg. Repeat 10-20 times 2-3 times daily. Perform this exercise against resistance later as your knee gets better.  °Quad and Hamstring Sets - Tighten up the muscle on the front of the thigh (Quad) and hold for 5-10 sec. Repeat this 10-20 times hourly. Hamstring sets are done by pushing the foot backward against an object and holding for 5-10 sec. Repeat as with quad sets.  °A rehabilitation program following serious knee injuries can speed recovery and prevent re-injury in the future due to weakened muscles. Contact your doctor or a physical therapist for more information on knee rehabilitation.  ° °SKILLED REHAB INSTRUCTIONS: °If the patient is transferred to a skilled rehab facility following release from the hospital, a list of the current medications will be sent to the facility for the patient to continue.  When discharged from the skilled rehab facility, please have the facility set up the patient's Home Health Physical Therapy prior to being released. Also, the skilled facility will be responsible for providing the patient with their medications at time of release from the facility to include their pain medication, the muscle relaxants, and their blood thinner medication. If the patient is still at the rehab facility at time of the two week follow up appointment, the skilled rehab facility will also need to assist the patient in arranging follow up appointment in our office and any transportation needs. ° °MAKE SURE YOU:  °Understand these instructions.  °Will watch your condition.  °Will get help right away if you are not doing well or get worse.   ° ° °Pick up stool softner and laxative for home use following surgery while on pain medications. °Do NOT remove your dressing. You may shower.  °Do not take tub baths or submerge incision under water. °May shower starting three days after surgery. °Please use a clean towel to pat the incision dry following showers. °Continue to use ice for pain and swelling after surgery. °Do not use any lotions or creams on the incision until instructed by your surgeon. ° °

## 2017-08-08 NOTE — Anesthesia Procedure Notes (Signed)
Spinal  Patient location during procedure: OR Start time: 08/08/2017 11:11 AM End time: 08/08/2017 11:18 AM Reason for block: at surgeon's request Staffing Anesthesiologist: Montez Hageman, MD Performed: anesthesiologist  Preanesthetic Checklist Completed: patient identified, site marked, surgical consent, pre-op evaluation, timeout performed, IV checked, risks and benefits discussed and monitors and equipment checked Spinal Block Patient position: sitting Prep: Betadine and DuraPrep Patient monitoring: continuous pulse ox, blood pressure and heart rate Approach: right paramedian Location: L3-4 Injection technique: single-shot Needle Needle type: Pencan  Needle gauge: 24 G Needle length: 9 cm Assessment Sensory level: T6 Additional Notes Expiration of kit checked and confirmed. Patient tolerated procedure well,without complications with noted clear CSF. Loss of motor and sensory on exam post injection.

## 2017-08-08 NOTE — Anesthesia Procedure Notes (Signed)
Anesthesia Regional Block: Adductor canal block   Pre-Anesthetic Checklist: ,, timeout performed, Correct Patient, Correct Site, Correct Laterality, Correct Procedure, Correct Position, site marked, Risks and benefits discussed,  Surgical consent,  Pre-op evaluation,  At surgeon's request and post-op pain management  Laterality: Left and Lower  Prep: Maximum Sterile Barrier Precautions used, chloraprep       Needles:  Injection technique: Single-shot  Needle Type: Echogenic Stimulator Needle     Needle Length: 10cm      Additional Needles:   Procedures:,,,, ultrasound used (permanent image in chart),,,,  Narrative:  Start time: 08/08/2017 10:12 AM End time: 08/08/2017 10:22 AM Injection made incrementally with aspirations every 5 mL.  Performed by: Personally  Anesthesiologist: Montez Hageman, MD  Additional Notes: Risks, benefits and alternative to block explained extensively.  Patient tolerated procedure well, without complications.

## 2017-08-08 NOTE — Anesthesia Preprocedure Evaluation (Signed)
Anesthesia Evaluation  Patient identified by MRN, date of birth, ID band Patient awake    Reviewed: Allergy & Precautions, NPO status , Patient's Chart, lab work & pertinent test results  Airway Mallampati: II  TM Distance: >3 FB Neck ROM: Full    Dental no notable dental hx.    Pulmonary former smoker,    Pulmonary exam normal breath sounds clear to auscultation       Cardiovascular Normal cardiovascular exam+ Valvular Problems/Murmurs MR  Rhythm:Regular Rate:Normal  VSD Pulmonary HTN   Neuro/Psych negative neurological ROS  negative psych ROS   GI/Hepatic negative GI ROS, Neg liver ROS,   Endo/Other  Hypothyroidism   Renal/GU negative Renal ROS  negative genitourinary   Musculoskeletal negative musculoskeletal ROS (+)   Abdominal   Peds negative pediatric ROS (+)  Hematology negative hematology ROS (+)   Anesthesia Other Findings   Reproductive/Obstetrics negative OB ROS                             Anesthesia Physical Anesthesia Plan  ASA: III  Anesthesia Plan: Spinal   Post-op Pain Management:  Regional for Post-op pain   Induction:   PONV Risk Score and Plan: 2 and Ondansetron and Treatment may vary due to age or medical condition  Airway Management Planned: Simple Face Mask  Additional Equipment:   Intra-op Plan:   Post-operative Plan:   Informed Consent: I have reviewed the patients History and Physical, chart, labs and discussed the procedure including the risks, benefits and alternatives for the proposed anesthesia with the patient or authorized representative who has indicated his/her understanding and acceptance.   Dental advisory given  Plan Discussed with:   Anesthesia Plan Comments:         Anesthesia Quick Evaluation

## 2017-08-08 NOTE — Transfer of Care (Signed)
Immediate Anesthesia Transfer of Care Note  Patient: [name]  Procedure(s) Performed: LEFT TOTAL KNEE ARTHROPLASTY WITH COMPUTER NAVIGATION (Left Knee)  Patient Location: PACU  Anesthesia Type:Spinal and MAC combined with regional for post-op pain  Level of Consciousness: awake, alert , oriented and patient cooperative  Airway & Oxygen Therapy: Patient Spontanous Breathing and Patient connected to face mask oxygen  Post-op Assessment: Report given to RN and Post -op Vital signs reviewed and stable  Post vital signs: Reviewed and stable  Last Vitals:  Vitals Value Taken Time  BP 102/58 08/08/2017  1:17 PM  Temp    Pulse 73 08/08/2017  1:19 PM  Resp 15 08/08/2017  1:20 PM  SpO2 99 % 08/08/2017  1:19 PM  Vitals shown include unvalidated device data.  Last Pain:  Vitals:   08/08/17 0846  TempSrc:   PainSc: 2       Patients Stated Pain Goal: 4 (34/19/62 2297)  Complications: No apparent anesthesia complications

## 2017-08-08 NOTE — Interval H&P Note (Signed)
History and Physical Interval Note:  08/08/2017 10:03 AM  [name]  has presented today for surgery, with the diagnosis of degenerative joint disease left knee  The various methods of treatment have been discussed with the patient and family. After consideration of risks, benefits and other options for treatment, the patient has consented to  Procedure(s) with comments: LEFT TOTAL KNEE ARTHROPLASTY WITH COMPUTER NAVIGATION (Left) - NEEDS RNFA as a surgical intervention .  The patient's history has been reviewed, patient examined, no change in status, stable for surgery.  I have reviewed the patient's chart and labs.  Questions were answered to the patient's satisfaction.     Hilton Cork Darletta Noblett

## 2017-08-08 NOTE — Op Note (Signed)
OPERATIVE REPORT  SURGEON: Rod Can, MD   ASSISTANT: Nehemiah Massed, PA-C.  PREOPERATIVE DIAGNOSIS: Left knee arthritis.   POSTOPERATIVE DIAGNOSIS: Left knee arthritis.   PROCEDURE: Left total knee arthroplasty.   IMPLANTS: Stryker Triathlon CR femur, size 4. Stryker Tritanium tibia, size 4. X3 polyethelyene insert, size 11 mm, CR. 3 button asymmetric patella, size 32 mm.  ANESTHESIA:  Regional and Spinal  TOURNIQUET TIME: Not utilized.   ESTIMATED BLOOD LOSS:-200 mL    ANTIBIOTICS: 2 g Ancef.  DRAINS: None.  COMPLICATIONS: None   CONDITION: PACU - hemodynamically stable.   BRIEF CLINICAL NOTE: [name] is a 67 y.o. female with a long-standing history of Left knee arthritis. After failing conservative management, the patient was indicated for total knee arthroplasty. The risks, benefits, and alternatives to the procedure were explained, and the patient elected to proceed.  PROCEDURE IN DETAIL: Adductor canal block was obtained in the pre-op holding area. Once inside the operative room, spinal anesthesia was obtained, and a foley catheter was inserted. The patient was then positioned, a nonsterile tourniquet was placed, and the lower extremity was prepped and draped in the normal sterile surgical fashion. A time-out was called verifying side and site of surgery. The patient received IV antibiotics within 60 minutes of beginning the procedure. The tourniquet was not utilized.  An anterior approach to the knee was performed utilizing a midvastus arthrotomy. A medial release was performed and the patellar fat pad was excised. Stryker navigation was used to cut the distal femur perpendicular to the mechanical axis. A freehand patellar resection was performed, and the patella was sized an prepared with 3 lug holes.  Nagivation was used to make a neutral proximal tibia  resection, taking 8 mm of bone from the less affected lateral side with 3 degrees of slope. The menisci were excised. A spacer block was placed, and the alignment and balance in extension were confirmed.   The distal femur was sized using the 3-degree external rotation guide referencing the posterior femoral cortex. The appropriate 4-in-1 cutting block was pinned into place. Rotation was checked using Whiteside's line, the epicondylar axis, and then confirmed with a spacer block in flexion. The remaining femoral cuts were performed, taking care to protect the MCL.  The tibia was sized and the trial tray was pinned into place. The remaining trail components were inserted. The knee was stable to varus and valgus stress through a full range of motion. The patella tracked centrally, and the PCL was well balanced. The trial components were removed, and the proximal tibial surface was prepared. Final components were impacted into place. The knee was tested for a final time and found to be well balanced.  The wound was copiously irrigated with normal saline with pulse lavage. Marcaine solution was injected into the periarticular soft tissue. The wound was closed in layers using #1 Vicryl and Stratafix for the fascia, 2-0 Vicryl for the subcutaneous fat, 2-0 Monocryl for the deep dermal layer, 3-0 running Monocryl subcuticular Stitch, and Dermabond for the skin. Once the glue was fully dried, an Aquacell Ag and compressive dressing were applied. Tthe patient was transported to the recovery room in stable condition. Sponge, needle, and instrument counts were correct at the end of the case x2. The patient tolerated the procedure well and there were no known complications.  Please note that a surgical assistant was a medical necessity for this procedure in order to perform it in a safe and expeditious manner. Surgical assistant was necessary  to retract the ligaments and vital neurovascular structures to prevent  injury to them and also necessary for proper positioning of the limb to allow for anatomic placement of the prosthesis.

## 2017-08-08 NOTE — Progress Notes (Signed)
AssistedDr. Marcell Barlow with left, ultrasound guided, knee, adductor canal block. Side rails up, monitors on throughout procedure. See vital signs in flow sheet. Tolerated Procedure well.

## 2017-08-09 ENCOUNTER — Encounter (HOSPITAL_COMMUNITY): Payer: Self-pay | Admitting: Orthopedic Surgery

## 2017-08-09 DIAGNOSIS — E039 Hypothyroidism, unspecified: Secondary | ICD-10-CM | POA: Diagnosis not present

## 2017-08-09 DIAGNOSIS — Z87891 Personal history of nicotine dependence: Secondary | ICD-10-CM | POA: Diagnosis not present

## 2017-08-09 DIAGNOSIS — Z79899 Other long term (current) drug therapy: Secondary | ICD-10-CM | POA: Diagnosis not present

## 2017-08-09 DIAGNOSIS — M1712 Unilateral primary osteoarthritis, left knee: Secondary | ICD-10-CM | POA: Diagnosis not present

## 2017-08-09 DIAGNOSIS — K5909 Other constipation: Secondary | ICD-10-CM | POA: Diagnosis not present

## 2017-08-09 DIAGNOSIS — Z79890 Hormone replacement therapy (postmenopausal): Secondary | ICD-10-CM | POA: Diagnosis not present

## 2017-08-09 LAB — BASIC METABOLIC PANEL
ANION GAP: 5 (ref 5–15)
BUN: 10 mg/dL (ref 8–23)
CHLORIDE: 110 mmol/L (ref 98–111)
CO2: 27 mmol/L (ref 22–32)
Calcium: 8.9 mg/dL (ref 8.9–10.3)
Creatinine, Ser: 0.56 mg/dL (ref 0.44–1.00)
GFR calc Af Amer: >60 mL/min (ref >60)
GFR calc non Af Amer: >60 mL/min (ref >60)
Glucose, Bld: 109 mg/dL — ABNORMAL HIGH (ref 70–99)
Potassium: 4.6 mmol/L (ref 3.5–5.1)
Sodium: 142 mmol/L (ref 135–145)

## 2017-08-09 LAB — CBC
HCT: 33.1 % — ABNORMAL LOW (ref 36.0–46.0)
HEMOGLOBIN: 11 g/dL — AB (ref 12.0–15.0)
MCH: 33 pg (ref 26.0–34.0)
MCHC: 33.2 g/dL (ref 30.0–36.0)
MCV: 99.4 fL (ref 78.0–100.0)
Platelets: 175 10*3/uL (ref 150–400)
RBC: 3.33 MIL/uL — AB (ref 3.87–5.11)
RDW: 12.1 % (ref 11.5–15.5)
WBC: 11.1 10*3/uL — AB (ref 4.0–10.5)

## 2017-08-09 MED ORDER — DOCUSATE SODIUM 100 MG PO CAPS: 100.0000 mg | ORAL_CAPSULE | Freq: Two times a day (BID) | ORAL | 0 refills | Status: DC

## 2017-08-09 MED ORDER — ASPIRIN 81 MG PO CHEW: 81.0000 mg | CHEWABLE_TABLET | Freq: Two times a day (BID) | ORAL | 1 refills | Status: DC

## 2017-08-09 MED ORDER — ALPRAZOLAM 0.25 MG PO TABS: 0.2500 mg | ORAL_TABLET | Freq: Every evening | ORAL | Status: DC | PRN

## 2017-08-09 MED ORDER — HYDROCODONE-ACETAMINOPHEN 5-325 MG PO TABS: 1.0000 | ORAL_TABLET | ORAL | 0 refills | Status: DC | PRN

## 2017-08-09 MED ORDER — SENNA 8.6 MG PO TABS: 1.0000 | ORAL_TABLET | Freq: Two times a day (BID) | ORAL | 0 refills | Status: DC

## 2017-08-09 MED ORDER — ONDANSETRON HCL 4 MG PO TABS: 4.0000 mg | ORAL_TABLET | Freq: Four times a day (QID) | ORAL | 0 refills | Status: DC | PRN

## 2017-08-09 NOTE — Care Management Obs Status (Signed)
[place] NOTIFICATION   Patient Details  Name: [name] MRN: [id number] Date of Birth: September 20, 1950   Medicare Observation Status Notification Given:  Yes    Guadalupe Maple, RN 08/09/2017, 11:00 AM

## 2017-08-09 NOTE — Anesthesia Postprocedure Evaluation (Signed)
Anesthesia Post Note  Patient: [name]  Procedure(s) Performed: LEFT TOTAL KNEE ARTHROPLASTY WITH COMPUTER NAVIGATION (Left Knee)     Patient location during evaluation: PACU Anesthesia Type: Spinal Level of consciousness: oriented and awake and alert Pain management: pain level controlled Vital Signs Assessment: post-procedure vital signs reviewed and stable Respiratory status: spontaneous breathing, respiratory function stable and patient connected to nasal cannula oxygen Cardiovascular status: blood pressure returned to baseline and stable Postop Assessment: no headache, no backache and no apparent nausea or vomiting Anesthetic complications: no    Last Vitals:  Vitals:   08/09/17 0619 08/09/17 1000  BP: (!) 118/50 132/64  Pulse: (!) 54 (!) 52  Resp: 17 14  Temp: 36.7 C 36.6 C  SpO2: 100% 100%    Last Pain:  Vitals:   08/09/17 1133  TempSrc:   PainSc: 5                  Abhiraj Dozal S

## 2017-08-09 NOTE — Progress Notes (Signed)
    Subjective:  Patient reports pain as mild to moderate.  Denies N/V/CP/SOB. No c/o.  Objective:   VITALS:   Vitals:   08/08/17 1829 08/08/17 2205 08/09/17 0156 08/09/17 0619  BP: 115/65 117/65 (!) 100/56 (!) 118/50  Pulse: 75 61 (!) 58 (!) 54  Resp: 18 17 17 17   Temp: 98.1 F (36.7 C) 98.2 F (36.8 C) (!) 97.4 F (36.3 C) 98.1 F (36.7 C)  TempSrc: Oral Oral Oral Oral  SpO2: 99% 100% 100% 100%  Weight:      Height:        NAD ABD soft Sensation intact distally Intact pulses distally Dorsiflexion/Plantar flexion intact Incision: dressing C/D/I Compartment soft   Lab Results  Component Value Date   WBC 11.1 (H) 08/09/2017   HGB 11.0 (L) 08/09/2017   HCT 33.1 (L) 08/09/2017   MCV 99.4 08/09/2017   PLT 175 08/09/2017   BMET    Component Value Date/Time   NA 142 08/09/2017 0529   K 4.6 08/09/2017 0529   CL 110 08/09/2017 0529   CO2 27 08/09/2017 0529   GLUCOSE 109 (H) 08/09/2017 0529   BUN 10 08/09/2017 0529   CREATININE 0.56 08/09/2017 0529   CALCIUM 8.9 08/09/2017 0529   GFRNONAA >60 08/09/2017 0529   GFRAA >60 08/09/2017 0529     Assessment/Plan: 1 Day Post-Op   Principal Problem:   Osteoarthritis of left knee   WBAT with walker DVT ppx: Aspirin, SCDs, TEDS PO pain control PT/OT Dispo: D/C home with outpatient PT    [name] 08/09/2017, 9:11 AM   Rod Can, MD Cell [phone]

## 2017-08-09 NOTE — Progress Notes (Signed)
Physical Therapy Treatment Patient Details Name: SUAD AUTREY MRN: [id number] DOB: [birth year] Today's Date: 08/09/2017    History of Present Illness s/p L TKA    PT Comments    Pt making excellent progress; ready for d/c with family support prn, will have 24hr assist if needed; has DME and is set up for OPPT  Follow Up Recommendations  Follow surgeon's recommendation for DC plan and follow-up therapies     Equipment Recommendations  None recommended by PT    Recommendations for Other Services       Precautions / Restrictions Precautions Precautions: Knee;Fall Restrictions Weight Bearing Restrictions: No Other Position/Activity Restrictions: WBAT    Mobility  Bed Mobility Overal bed mobility: Needs Assistance Bed Mobility: Supine to Sit     Supine to sit: Supervision     General bed mobility comments: for safety  Transfers Overall transfer level: Needs assistance Equipment used: Rolling walker (2 wheeled) Transfers: Sit to/from Stand Sit to Stand: Min guard;Supervision         General transfer comment: cues for hand placement  Ambulation/Gait Ambulation/Gait assistance: Supervision;Min guard Gait Distance (Feet): 120 Feet Assistive device: Rolling walker (2 wheeled) Gait Pattern/deviations: Step-to pattern;Step-through pattern     General Gait Details: cues for RW position, safety, incr step length   Stairs Stairs: Yes Stairs assistance: Min guard;Min assist Stair Management: No rails;Step to pattern;Backwards;With walker Number of Stairs: 2 General stair comments: cues for sequence and safe technique   Wheelchair Mobility    Modified Rankin (Stroke Patients Only)       Balance                                            Cognition Arousal/Alertness: Awake/alert Behavior During Therapy: WFL for tasks assessed/performed Overall Cognitive Status: Within Functional Limits for tasks assessed                                         Exercises Total Joint Exercises Ankle Circles/Pumps: AROM;Both;10 reps Quad Sets: AROM;Both;10 reps Short Arc Quad: AROM;Left;10 reps Heel Slides: AAROM;AROM;Left;10 reps Hip ABduction/ADduction: AROM;10 reps;Left Straight Leg Raises: AROM;Left;10 reps Long Arc Quad: AROM;Left;5 reps Knee Flexion: AAROM;Left;10 reps Goniometric ROM: 10* to 94*  L  knee flexion, AAROM    General Comments        Pertinent Vitals/Pain Pain Assessment: 0-10 Pain Score: 2  Pain Location: L knee Pain Descriptors / Indicators: Sore Pain Intervention(s): Monitored during session;Ice applied    Home Living                      Prior Function            PT Goals (current goals can now be found in the care plan section) Acute Rehab PT Goals Patient Stated Goal: less knee pain PT Goal Formulation: With patient Time For Goal Achievement: 08/15/17 Potential to Achieve Goals: Good Progress towards PT goals: Progressing toward goals    Frequency    7X/week      PT Plan Current plan remains appropriate    Co-evaluation              AM-PAC PT "6 Clicks" Daily Activity  Outcome Measure  Difficulty turning over in bed (including adjusting bedclothes, sheets and blankets)?: A  Little Difficulty moving from lying on back to sitting on the side of the bed? : A Little Difficulty sitting down on and standing up from a chair with arms (e.g., wheelchair, bedside commode, etc,.)?: A Little Help needed moving to and from a bed to chair (including a wheelchair)?: A Little Help needed walking in hospital room?: A Little Help needed climbing 3-5 steps with a railing? : A Little 6 Click Score: 18    End of Session Equipment Utilized During Treatment: Gait belt Activity Tolerance: Patient tolerated treatment well Patient left: in chair;with call bell/phone within reach;with bed alarm set;with family/visitor present Nurse Communication: Mobility status(ready fo  rd/c) PT Visit Diagnosis: Difficulty in walking, not elsewhere classified (R26.2)     Time: 0518-3358 PT Time Calculation (min) (ACUTE ONLY): 34 min  Charges:  $Gait Training: 8-22 mins $Therapeutic Exercise: 8-22 mins                    G Codes:          Tsering Leaman 08-11-2017, 12:04 PM

## 2017-08-09 NOTE — Progress Notes (Signed)
RN explained paper work with patient and family, IV d/c'd, reinforced taking all your belongings. Pt was taken downstairs in wheelchair.

## 2017-08-09 NOTE — Discharge Summary (Signed)
Physician Discharge Summary  Patient ID: [name] MRN: [id number] DOB/AGE: 69-15-1952 69 y.o.  Admit date: 08/08/2017 Discharge date: 08/09/2017  Admission Diagnoses:  Osteoarthritis of left knee  Discharge Diagnoses:  Principal Problem:   Osteoarthritis of left knee   Past Medical History:  Diagnosis Date  . Bunion   . Chronic constipation   . Depression    pt denies  . Diverticulosis   . Family history of adverse reaction to anesthesia    Sister PONV  . Heart murmur   . History of kidney stones   . Hypothyroidism   . Mitral regurgitation 07/25/2017   Moderate, noted on ECHO  . Neuroma of foot   . OA (osteoarthritis)    Left knee, hands  . Seasonal allergies   . 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 Tinnitus   . Ventricular septal defect (VSD), membranous     no obvious right ventricular compromise, last ECHO 07/25/2017    Surgeries: Procedure(s): LEFT TOTAL KNEE ARTHROPLASTY WITH COMPUTER NAVIGATION on 08/08/2017   Consultants (if any):   Discharged Condition: Improved  Hospital Course: [name] is an 67 y.o. female who was admitted 08/08/2017 with a diagnosis of Osteoarthritis of left knee and went to the operating room on 08/08/2017 and underwent the above named procedures.    She was given perioperative antibiotics:  Anti-infectives (From admission, onward)   Start     Dose/Rate Route Frequency Ordered Stop   08/08/17 1800  ceFAZolin (ANCEF) IVPB 2g/100 mL premix     2 g 200 mL/hr over 30 Minutes Intravenous Every 6 hours 08/08/17 1538 08/09/17 0059   08/08/17 0856  ceFAZolin (ANCEF) 2-4 GM/100ML-% IVPB    Note to Pharmacy:  Randa Evens  : cabinet override      08/08/17 0856 08/08/17 1119   08/08/17 0830  ceFAZolin (ANCEF) IVPB 2g/100 mL premix     2 g 200 mL/hr over 30 Minutes Intravenous On call to O.R. 08/08/17 4332 08/08/17 1149    .  She was given sequential compression devices, early ambulation, and ASA for DVT  prophylaxis.  She benefited maximally from the hospital stay and there were no complications.    Recent vital signs:  Vitals:   08/09/17 0156 08/09/17 0619  BP: (!) 100/56 (!) 118/50  Pulse: (!) 58 (!) 54  Resp: 17 17  Temp: (!) 97.4 F (36.3 C) 98.1 F (36.7 C)  SpO2: 100% 100%    Recent laboratory studies:  Lab Results  Component Value Date   HGB 11.0 (L) 08/09/2017   HGB 13.5 07/31/2017   Lab Results  Component Value Date   WBC 11.1 (H) 08/09/2017   PLT 175 08/09/2017   No results found for: INR Lab Results  Component Value Date   NA 142 08/09/2017   K 4.6 08/09/2017   CL 110 08/09/2017   CO2 27 08/09/2017   BUN 10 08/09/2017   CREATININE 0.56 08/09/2017   GLUCOSE 109 (H) 08/09/2017    Discharge Medications:   Allergies as of 08/09/2017      Reactions   Sulfa Antibiotics Rash      Medication List    STOP taking these medications   PREMARIN 0.625 MG tablet Generic drug:  estrogens (conjugated)     TAKE these medications   ALPRAZolam 0.25 MG tablet Commonly known as:  XANAX Take 0.25 mg by mouth at bedtime as needed. May take one tablet once every month or two  aspirin 81 MG chewable tablet Chew 1 tablet (81 mg total) by mouth 2 (two) times daily.   CALCIUM + D PO Take 1,200 mg by mouth daily.   clobetasol cream 0.05 % Commonly known as:  TEMOVATE Apply 1 application topically 3 (three) times daily as needed (eczema).   desonide 0.05 % cream Commonly known as:  DESOWEN Apply 1 application topically as needed. ONCE DAILY IF NEEDED   diphenhydrAMINE 25 MG tablet Commonly known as:  BENADRYL Take 25 mg by mouth 2 (two) times daily as needed. Patient alternates with Xyzal   docusate sodium 100 MG capsule Commonly known as:  COLACE Take 1 capsule (100 mg total) by mouth 2 (two) times daily.   ELDERBERRY PO Take 2 tablets by mouth daily.   EPIPEN 2-PAK 0.3 mg/0.3 mL Soaj injection Generic drug:  EPINEPHrine Inject 0.3 mg into the muscle  once. Reported on 01/25/2015   fish oil-omega-3 fatty acids 1000 MG capsule Take 2 g by mouth daily.   fluticasone 50 MCG/ACT nasal spray Commonly known as:  FLONASE Place 2 sprays into both nostrils 2 (two) times daily as needed for allergies.   HYDROcodone-acetaminophen 5-325 MG tablet Commonly known as:  NORCO/VICODIN Take 1-2 tablets by mouth every 4 (four) hours as needed for moderate pain (pain score 4-6).   levocetirizine 5 MG tablet Commonly known as:  XYZAL TAKE 1 TABLET (5 MG TOTAL) BY MOUTH DAILY.   levothyroxine 75 MCG tablet Commonly known as:  SYNTHROID, LEVOTHROID Take 75 mcg by mouth daily before breakfast.   Melatonin 10 MG Tabs Take 1 tablet by mouth at bedtime.   montelukast 10 MG tablet Commonly known as:  SINGULAIR TAKE 1 TABLET (10 MG TOTAL) BY MOUTH AT BEDTIME.   multivitamin capsule Take 1 capsule by mouth daily.   ondansetron 4 MG tablet Commonly known as:  ZOFRAN Take 1 tablet (4 mg total) by mouth every 6 (six) hours as needed for nausea.   PAZEO 0.7 % Soln Generic drug:  Olopatadine HCl Place 1 drop into both eyes 2 (two) times daily as needed (eye allergies).   polyethylene glycol powder powder Commonly known as:  GLYCOLAX/MIRALAX MIX AND TAKE ONE TO TWO CAPFULS NIGHTLY AS NEEDED. What changed:    how much to take  how to take this  when to take this  additional instructions   Potassium 95 MG Tabs Take 1 tablet by mouth every other day.   PROBIOTIC DAILY PO Take 1 capsule by mouth daily.   senna 8.6 MG Tabs tablet Commonly known as:  SENOKOT Take 1 tablet (8.6 mg total) by mouth 2 (two) times daily.   TURMERIC PO Take 1 capsule by mouth 2 (two) times daily.   Vitamin B-12 1000 MCG Subl Place 1 tablet under the tongue daily.   vitamin C 1000 MG tablet Take 1,000 mg by mouth daily as needed (Flu season).   vitamin E 400 UNIT capsule Take 400 Units by mouth daily.   VOLTAREN 1 % Gel Generic drug:  diclofenac  sodium Take 1 application by mouth 2 (two) times daily as needed.   zolpidem 10 MG tablet Commonly known as:  AMBIEN Take 5 mg by mouth at bedtime as needed for sleep.       Diagnostic Studies: Dg Knee Left Port  Result Date: 08/08/2017 CLINICAL DATA:  Status post left knee replacement. EXAM: PORTABLE LEFT KNEE - 1-2 VIEW COMPARISON:  None. FINDINGS: The femoral, tibial and patellar components appear to be well  situated. Expected postoperative changes are seen in the soft tissues anteriorly and posteriorly. No fracture or dislocation is noted. IMPRESSION: Status post left total knee arthroplasty. Electronically Signed   By: Marijo Conception, M.D.   On: 08/08/2017 13:45    Disposition: Discharge disposition: 01-Home or Self Care       Discharge Instructions    Call MD / Call 911   Complete by:  As directed    If you experience chest pain or shortness of breath, CALL 911 and be transported to the hospital emergency room.  If you develope a fever above 101 F, pus (white drainage) or increased drainage or redness at the wound, or calf pain, call your surgeon's office.   Constipation Prevention   Complete by:  As directed    Drink plenty of fluids.  Prune juice may be helpful.  You may use a stool softener, such as Colace (over the counter) 100 mg twice a day.  Use MiraLax (over the counter) for constipation as needed.   Diet - low sodium heart healthy   Complete by:  As directed    Do not put a pillow under the knee. Place it under the heel.   Complete by:  As directed    Driving restrictions   Complete by:  As directed    No driving for 6 weeks   Increase activity slowly as tolerated   Complete by:  As directed    Lifting restrictions   Complete by:  As directed    No lifting for 6 weeks   TED hose   Complete by:  As directed    Use stockings (TED hose) for 2 weeks on both leg(s).  You may remove them at night for sleeping.      Follow-up Information    Maryem Shuffler, Aaron Edelman,  MD. Schedule an appointment as soon as possible for a visit in 2 weeks.   Specialty:  Orthopedic Surgery Why:  For wound re-check Contact information: [address] [place] [address] [phone]            Signed: Hilton Cork Santos Sollenberger 08/09/2017, 9:15 AM

## 2017-08-13 DIAGNOSIS — Z96652 Presence of left artificial knee joint: Secondary | ICD-10-CM | POA: Diagnosis not present

## 2017-08-13 DIAGNOSIS — M25562 Pain in left knee: Secondary | ICD-10-CM | POA: Diagnosis not present

## 2017-08-13 DIAGNOSIS — R2689 Other abnormalities of gait and mobility: Secondary | ICD-10-CM | POA: Diagnosis not present

## 2017-08-13 DIAGNOSIS — Z471 Aftercare following joint replacement surgery: Secondary | ICD-10-CM | POA: Diagnosis not present

## 2017-08-15 DIAGNOSIS — Z96652 Presence of left artificial knee joint: Secondary | ICD-10-CM | POA: Diagnosis not present

## 2017-08-15 DIAGNOSIS — R2689 Other abnormalities of gait and mobility: Secondary | ICD-10-CM | POA: Diagnosis not present

## 2017-08-15 DIAGNOSIS — M25562 Pain in left knee: Secondary | ICD-10-CM | POA: Diagnosis not present

## 2017-08-15 DIAGNOSIS — Z471 Aftercare following joint replacement surgery: Secondary | ICD-10-CM | POA: Diagnosis not present

## 2017-08-16 DIAGNOSIS — M25562 Pain in left knee: Secondary | ICD-10-CM | POA: Diagnosis not present

## 2017-08-16 DIAGNOSIS — Z471 Aftercare following joint replacement surgery: Secondary | ICD-10-CM | POA: Diagnosis not present

## 2017-08-16 DIAGNOSIS — Z96652 Presence of left artificial knee joint: Secondary | ICD-10-CM | POA: Diagnosis not present

## 2017-08-16 DIAGNOSIS — R2689 Other abnormalities of gait and mobility: Secondary | ICD-10-CM | POA: Diagnosis not present

## 2017-08-19 DIAGNOSIS — M25562 Pain in left knee: Secondary | ICD-10-CM | POA: Diagnosis not present

## 2017-08-19 DIAGNOSIS — R2689 Other abnormalities of gait and mobility: Secondary | ICD-10-CM | POA: Diagnosis not present

## 2017-08-19 DIAGNOSIS — Z471 Aftercare following joint replacement surgery: Secondary | ICD-10-CM | POA: Diagnosis not present

## 2017-08-19 DIAGNOSIS — Z96652 Presence of left artificial knee joint: Secondary | ICD-10-CM | POA: Diagnosis not present

## 2017-08-21 DIAGNOSIS — M1712 Unilateral primary osteoarthritis, left knee: Secondary | ICD-10-CM | POA: Diagnosis not present

## 2017-08-21 DIAGNOSIS — Z471 Aftercare following joint replacement surgery: Secondary | ICD-10-CM | POA: Diagnosis not present

## 2017-08-21 DIAGNOSIS — Z96652 Presence of left artificial knee joint: Secondary | ICD-10-CM | POA: Diagnosis not present

## 2017-08-22 DIAGNOSIS — Z96652 Presence of left artificial knee joint: Secondary | ICD-10-CM | POA: Diagnosis not present

## 2017-08-22 DIAGNOSIS — Z471 Aftercare following joint replacement surgery: Secondary | ICD-10-CM | POA: Diagnosis not present

## 2017-08-22 DIAGNOSIS — R2689 Other abnormalities of gait and mobility: Secondary | ICD-10-CM | POA: Diagnosis not present

## 2017-08-22 DIAGNOSIS — M25562 Pain in left knee: Secondary | ICD-10-CM | POA: Diagnosis not present

## 2017-08-23 DIAGNOSIS — Z471 Aftercare following joint replacement surgery: Secondary | ICD-10-CM | POA: Diagnosis not present

## 2017-08-23 DIAGNOSIS — Z96652 Presence of left artificial knee joint: Secondary | ICD-10-CM | POA: Diagnosis not present

## 2017-08-23 DIAGNOSIS — R2689 Other abnormalities of gait and mobility: Secondary | ICD-10-CM | POA: Diagnosis not present

## 2017-08-23 DIAGNOSIS — M25562 Pain in left knee: Secondary | ICD-10-CM | POA: Diagnosis not present

## 2017-08-26 DIAGNOSIS — M25562 Pain in left knee: Secondary | ICD-10-CM | POA: Diagnosis not present

## 2017-08-26 DIAGNOSIS — Z471 Aftercare following joint replacement surgery: Secondary | ICD-10-CM | POA: Diagnosis not present

## 2017-08-26 DIAGNOSIS — Z96652 Presence of left artificial knee joint: Secondary | ICD-10-CM | POA: Diagnosis not present

## 2017-08-26 DIAGNOSIS — R2689 Other abnormalities of gait and mobility: Secondary | ICD-10-CM | POA: Diagnosis not present

## 2017-08-28 DIAGNOSIS — Z471 Aftercare following joint replacement surgery: Secondary | ICD-10-CM | POA: Diagnosis not present

## 2017-08-28 DIAGNOSIS — M25562 Pain in left knee: Secondary | ICD-10-CM | POA: Diagnosis not present

## 2017-08-28 DIAGNOSIS — R2689 Other abnormalities of gait and mobility: Secondary | ICD-10-CM | POA: Diagnosis not present

## 2017-08-28 DIAGNOSIS — Z96652 Presence of left artificial knee joint: Secondary | ICD-10-CM | POA: Diagnosis not present

## 2017-08-30 DIAGNOSIS — R2689 Other abnormalities of gait and mobility: Secondary | ICD-10-CM | POA: Diagnosis not present

## 2017-08-30 DIAGNOSIS — Z471 Aftercare following joint replacement surgery: Secondary | ICD-10-CM | POA: Diagnosis not present

## 2017-08-30 DIAGNOSIS — M25562 Pain in left knee: Secondary | ICD-10-CM | POA: Diagnosis not present

## 2017-08-30 DIAGNOSIS — Z96652 Presence of left artificial knee joint: Secondary | ICD-10-CM | POA: Diagnosis not present

## 2017-09-02 DIAGNOSIS — R2689 Other abnormalities of gait and mobility: Secondary | ICD-10-CM | POA: Diagnosis not present

## 2017-09-02 DIAGNOSIS — Z96652 Presence of left artificial knee joint: Secondary | ICD-10-CM | POA: Diagnosis not present

## 2017-09-02 DIAGNOSIS — M25562 Pain in left knee: Secondary | ICD-10-CM | POA: Diagnosis not present

## 2017-09-02 DIAGNOSIS — Z471 Aftercare following joint replacement surgery: Secondary | ICD-10-CM | POA: Diagnosis not present

## 2017-09-04 DIAGNOSIS — Z96652 Presence of left artificial knee joint: Secondary | ICD-10-CM | POA: Diagnosis not present

## 2017-09-04 DIAGNOSIS — R2689 Other abnormalities of gait and mobility: Secondary | ICD-10-CM | POA: Diagnosis not present

## 2017-09-04 DIAGNOSIS — M25562 Pain in left knee: Secondary | ICD-10-CM | POA: Diagnosis not present

## 2017-09-04 DIAGNOSIS — Z471 Aftercare following joint replacement surgery: Secondary | ICD-10-CM | POA: Diagnosis not present

## 2017-09-06 DIAGNOSIS — M25562 Pain in left knee: Secondary | ICD-10-CM | POA: Diagnosis not present

## 2017-09-06 DIAGNOSIS — Z471 Aftercare following joint replacement surgery: Secondary | ICD-10-CM | POA: Diagnosis not present

## 2017-09-06 DIAGNOSIS — Z96652 Presence of left artificial knee joint: Secondary | ICD-10-CM | POA: Diagnosis not present

## 2017-09-06 DIAGNOSIS — R2689 Other abnormalities of gait and mobility: Secondary | ICD-10-CM | POA: Diagnosis not present

## 2017-09-09 DIAGNOSIS — M25562 Pain in left knee: Secondary | ICD-10-CM | POA: Diagnosis not present

## 2017-09-09 DIAGNOSIS — Z96652 Presence of left artificial knee joint: Secondary | ICD-10-CM | POA: Diagnosis not present

## 2017-09-09 DIAGNOSIS — R2689 Other abnormalities of gait and mobility: Secondary | ICD-10-CM | POA: Diagnosis not present

## 2017-09-09 DIAGNOSIS — Z471 Aftercare following joint replacement surgery: Secondary | ICD-10-CM | POA: Diagnosis not present

## 2017-09-11 DIAGNOSIS — Z96652 Presence of left artificial knee joint: Secondary | ICD-10-CM | POA: Diagnosis not present

## 2017-09-11 DIAGNOSIS — M25562 Pain in left knee: Secondary | ICD-10-CM | POA: Diagnosis not present

## 2017-09-11 DIAGNOSIS — Z471 Aftercare following joint replacement surgery: Secondary | ICD-10-CM | POA: Diagnosis not present

## 2017-09-11 DIAGNOSIS — R2689 Other abnormalities of gait and mobility: Secondary | ICD-10-CM | POA: Diagnosis not present

## 2017-09-13 DIAGNOSIS — Z471 Aftercare following joint replacement surgery: Secondary | ICD-10-CM | POA: Diagnosis not present

## 2017-09-13 DIAGNOSIS — Z96652 Presence of left artificial knee joint: Secondary | ICD-10-CM | POA: Diagnosis not present

## 2017-09-13 DIAGNOSIS — M25562 Pain in left knee: Secondary | ICD-10-CM | POA: Diagnosis not present

## 2017-09-13 DIAGNOSIS — R2689 Other abnormalities of gait and mobility: Secondary | ICD-10-CM | POA: Diagnosis not present

## 2017-09-16 DIAGNOSIS — Z471 Aftercare following joint replacement surgery: Secondary | ICD-10-CM | POA: Diagnosis not present

## 2017-09-16 DIAGNOSIS — R2689 Other abnormalities of gait and mobility: Secondary | ICD-10-CM | POA: Diagnosis not present

## 2017-09-16 DIAGNOSIS — Z96652 Presence of left artificial knee joint: Secondary | ICD-10-CM | POA: Diagnosis not present

## 2017-09-16 DIAGNOSIS — M25562 Pain in left knee: Secondary | ICD-10-CM | POA: Diagnosis not present

## 2017-09-17 DIAGNOSIS — Z96652 Presence of left artificial knee joint: Secondary | ICD-10-CM | POA: Diagnosis not present

## 2017-09-17 DIAGNOSIS — Z471 Aftercare following joint replacement surgery: Secondary | ICD-10-CM | POA: Diagnosis not present

## 2017-09-17 DIAGNOSIS — M1712 Unilateral primary osteoarthritis, left knee: Secondary | ICD-10-CM | POA: Diagnosis not present

## 2017-09-18 DIAGNOSIS — Z471 Aftercare following joint replacement surgery: Secondary | ICD-10-CM | POA: Diagnosis not present

## 2017-09-18 DIAGNOSIS — M25562 Pain in left knee: Secondary | ICD-10-CM | POA: Diagnosis not present

## 2017-09-18 DIAGNOSIS — R2689 Other abnormalities of gait and mobility: Secondary | ICD-10-CM | POA: Diagnosis not present

## 2017-09-18 DIAGNOSIS — Z96652 Presence of left artificial knee joint: Secondary | ICD-10-CM | POA: Diagnosis not present

## 2017-09-19 DIAGNOSIS — M9903 Segmental and somatic dysfunction of lumbar region: Secondary | ICD-10-CM | POA: Diagnosis not present

## 2017-09-19 DIAGNOSIS — S338XXA Sprain of other parts of lumbar spine and pelvis, initial encounter: Secondary | ICD-10-CM | POA: Diagnosis not present

## 2017-09-20 DIAGNOSIS — Z471 Aftercare following joint replacement surgery: Secondary | ICD-10-CM | POA: Diagnosis not present

## 2017-09-20 DIAGNOSIS — R2689 Other abnormalities of gait and mobility: Secondary | ICD-10-CM | POA: Diagnosis not present

## 2017-09-20 DIAGNOSIS — Z96652 Presence of left artificial knee joint: Secondary | ICD-10-CM | POA: Diagnosis not present

## 2017-09-20 DIAGNOSIS — M25562 Pain in left knee: Secondary | ICD-10-CM | POA: Diagnosis not present

## 2017-09-23 DIAGNOSIS — M9903 Segmental and somatic dysfunction of lumbar region: Secondary | ICD-10-CM | POA: Diagnosis not present

## 2017-09-23 DIAGNOSIS — S338XXA Sprain of other parts of lumbar spine and pelvis, initial encounter: Secondary | ICD-10-CM | POA: Diagnosis not present

## 2017-09-24 DIAGNOSIS — D519 Vitamin B12 deficiency anemia, unspecified: Secondary | ICD-10-CM | POA: Diagnosis not present

## 2017-09-24 DIAGNOSIS — R51 Headache: Secondary | ICD-10-CM | POA: Diagnosis not present

## 2017-09-24 DIAGNOSIS — Z6822 Body mass index (BMI) 22.0-22.9, adult: Secondary | ICD-10-CM | POA: Diagnosis not present

## 2017-09-24 DIAGNOSIS — R5383 Other fatigue: Secondary | ICD-10-CM | POA: Diagnosis not present

## 2017-09-25 DIAGNOSIS — S338XXA Sprain of other parts of lumbar spine and pelvis, initial encounter: Secondary | ICD-10-CM | POA: Diagnosis not present

## 2017-09-25 DIAGNOSIS — M9903 Segmental and somatic dysfunction of lumbar region: Secondary | ICD-10-CM | POA: Diagnosis not present

## 2017-10-01 DIAGNOSIS — S338XXA Sprain of other parts of lumbar spine and pelvis, initial encounter: Secondary | ICD-10-CM | POA: Diagnosis not present

## 2017-10-01 DIAGNOSIS — M9903 Segmental and somatic dysfunction of lumbar region: Secondary | ICD-10-CM | POA: Diagnosis not present

## 2017-10-03 DIAGNOSIS — M9903 Segmental and somatic dysfunction of lumbar region: Secondary | ICD-10-CM | POA: Diagnosis not present

## 2017-10-03 DIAGNOSIS — S338XXA Sprain of other parts of lumbar spine and pelvis, initial encounter: Secondary | ICD-10-CM | POA: Diagnosis not present

## 2017-10-07 DIAGNOSIS — M9903 Segmental and somatic dysfunction of lumbar region: Secondary | ICD-10-CM | POA: Diagnosis not present

## 2017-10-07 DIAGNOSIS — S338XXA Sprain of other parts of lumbar spine and pelvis, initial encounter: Secondary | ICD-10-CM | POA: Diagnosis not present

## 2017-10-10 DIAGNOSIS — M9903 Segmental and somatic dysfunction of lumbar region: Secondary | ICD-10-CM | POA: Diagnosis not present

## 2017-10-10 DIAGNOSIS — S338XXA Sprain of other parts of lumbar spine and pelvis, initial encounter: Secondary | ICD-10-CM | POA: Diagnosis not present

## 2017-11-19 DIAGNOSIS — S134XXA Sprain of ligaments of cervical spine, initial encounter: Secondary | ICD-10-CM | POA: Diagnosis not present

## 2017-11-19 DIAGNOSIS — S338XXA Sprain of other parts of lumbar spine and pelvis, initial encounter: Secondary | ICD-10-CM | POA: Diagnosis not present

## 2017-11-19 DIAGNOSIS — M9903 Segmental and somatic dysfunction of lumbar region: Secondary | ICD-10-CM | POA: Diagnosis not present

## 2017-11-19 DIAGNOSIS — M9901 Segmental and somatic dysfunction of cervical region: Secondary | ICD-10-CM | POA: Diagnosis not present

## 2017-11-19 DIAGNOSIS — S233XXA Sprain of ligaments of thoracic spine, initial encounter: Secondary | ICD-10-CM | POA: Diagnosis not present

## 2017-11-19 DIAGNOSIS — M9902 Segmental and somatic dysfunction of thoracic region: Secondary | ICD-10-CM | POA: Diagnosis not present

## 2017-12-18 DIAGNOSIS — M9901 Segmental and somatic dysfunction of cervical region: Secondary | ICD-10-CM | POA: Diagnosis not present

## 2017-12-18 DIAGNOSIS — S338XXA Sprain of other parts of lumbar spine and pelvis, initial encounter: Secondary | ICD-10-CM | POA: Diagnosis not present

## 2017-12-18 DIAGNOSIS — S233XXA Sprain of ligaments of thoracic spine, initial encounter: Secondary | ICD-10-CM | POA: Diagnosis not present

## 2017-12-18 DIAGNOSIS — S134XXA Sprain of ligaments of cervical spine, initial encounter: Secondary | ICD-10-CM | POA: Diagnosis not present

## 2017-12-18 DIAGNOSIS — M9903 Segmental and somatic dysfunction of lumbar region: Secondary | ICD-10-CM | POA: Diagnosis not present

## 2017-12-18 DIAGNOSIS — M9902 Segmental and somatic dysfunction of thoracic region: Secondary | ICD-10-CM | POA: Diagnosis not present

## 2018-01-01 DIAGNOSIS — M9903 Segmental and somatic dysfunction of lumbar region: Secondary | ICD-10-CM | POA: Diagnosis not present

## 2018-01-01 DIAGNOSIS — S134XXA Sprain of ligaments of cervical spine, initial encounter: Secondary | ICD-10-CM | POA: Diagnosis not present

## 2018-01-01 DIAGNOSIS — S338XXA Sprain of other parts of lumbar spine and pelvis, initial encounter: Secondary | ICD-10-CM | POA: Diagnosis not present

## 2018-01-01 DIAGNOSIS — M9901 Segmental and somatic dysfunction of cervical region: Secondary | ICD-10-CM | POA: Diagnosis not present

## 2018-01-01 DIAGNOSIS — M9902 Segmental and somatic dysfunction of thoracic region: Secondary | ICD-10-CM | POA: Diagnosis not present

## 2018-01-01 DIAGNOSIS — S233XXA Sprain of ligaments of thoracic spine, initial encounter: Secondary | ICD-10-CM | POA: Diagnosis not present

## 2018-01-10 DIAGNOSIS — E038 Other specified hypothyroidism: Secondary | ICD-10-CM | POA: Diagnosis not present

## 2018-01-10 DIAGNOSIS — R5383 Other fatigue: Secondary | ICD-10-CM | POA: Diagnosis not present

## 2018-01-10 DIAGNOSIS — E7800 Pure hypercholesterolemia, unspecified: Secondary | ICD-10-CM | POA: Diagnosis not present

## 2018-01-10 DIAGNOSIS — Z79899 Other long term (current) drug therapy: Secondary | ICD-10-CM | POA: Diagnosis not present

## 2018-01-10 DIAGNOSIS — R011 Cardiac murmur, unspecified: Secondary | ICD-10-CM | POA: Diagnosis not present

## 2018-01-10 DIAGNOSIS — M810 Age-related osteoporosis without current pathological fracture: Secondary | ICD-10-CM | POA: Diagnosis not present

## 2018-01-10 DIAGNOSIS — I350 Nonrheumatic aortic (valve) stenosis: Secondary | ICD-10-CM | POA: Diagnosis not present

## 2018-01-14 DIAGNOSIS — M810 Age-related osteoporosis without current pathological fracture: Secondary | ICD-10-CM | POA: Diagnosis not present

## 2018-01-14 DIAGNOSIS — R011 Cardiac murmur, unspecified: Secondary | ICD-10-CM | POA: Diagnosis not present

## 2018-01-14 DIAGNOSIS — Z6823 Body mass index (BMI) 23.0-23.9, adult: Secondary | ICD-10-CM | POA: Diagnosis not present

## 2018-01-14 DIAGNOSIS — G4709 Other insomnia: Secondary | ICD-10-CM | POA: Diagnosis not present

## 2018-01-14 DIAGNOSIS — E038 Other specified hypothyroidism: Secondary | ICD-10-CM | POA: Diagnosis not present

## 2018-01-16 ENCOUNTER — Encounter: Payer: Self-pay | Admitting: Cardiology

## 2018-01-16 ENCOUNTER — Ambulatory Visit (INDEPENDENT_AMBULATORY_CARE_PROVIDER_SITE_OTHER): Payer: Medicare Other | Admitting: Cardiology

## 2018-01-16 VITALS — BP 126/80 | HR 88 | Ht 62.0 in | Wt 131.0 lb

## 2018-01-16 DIAGNOSIS — Q210 Ventricular septal defect: Secondary | ICD-10-CM

## 2018-01-16 DIAGNOSIS — R002 Palpitations: Secondary | ICD-10-CM | POA: Diagnosis not present

## 2018-01-16 DIAGNOSIS — I340 Nonrheumatic mitral (valve) insufficiency: Secondary | ICD-10-CM

## 2018-01-16 NOTE — Patient Instructions (Addendum)

## 2018-01-16 NOTE — Progress Notes (Signed)
Cardiology Office Note  Date: 01/16/2018   [name], DOB 23-Dec-1950, MRN [id number]  PCP: Rory Percy, MD  Primary Cardiologist: Rozann Lesches, MD   Chief Complaint  Patient presents with  . Cardiac follow-up    History of Present Illness: [name] is a 67 y.o. female last seen in June.  She presents for a routine follow-up visit.  She tells me that it has been 4 months since she had an episode of brief rapid palpitations.  She does not indicate any progressive shortness of breath or functional limitations.  She has had no syncope.  I went over her follow-up echocardiogram done in June, results outlined below.  She continues to follow with Dr. Nadara Mustard for primary care.  Past Medical History:  Diagnosis Date  . Bunion   . Chronic constipation   . Diverticulosis   . History of kidney stones   . Hypothyroidism   . Mitral regurgitation    Moderate  . Neuroma of foot   . OA (osteoarthritis)    Left knee, hands  . Seasonal allergies   . 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 Tinnitus   . Ventricular septal defect (VSD), membranous     Past Surgical History:  Procedure Laterality Date  . ABDOMINAL HYSTERECTOMY    . BREAST EXCISIONAL BIOPSY Right 1996  . BREAST LUMPECTOMY Right    benign  . COLONOSCOPY   02/03/2003   RMR: Normal rectum and colon  . COLONOSCOPY N/A 03/10/2013   Dr. Gala Romney-  minimal internal hemorrhoids/ anal papilla o/w normal appearing rectum. colonic diverticulosis  . KNEE ARTHROPLASTY Left 08/08/2017   Procedure: LEFT TOTAL KNEE ARTHROPLASTY WITH COMPUTER NAVIGATION;  Surgeon: Rod Can, MD;  Location: [facility];  Service: Orthopedics;  Laterality: Left;  NEEDS RNFA  . KNEE ARTHROSCOPY Left 11/30/2016  . SHOULDER ARTHROSCOPY Left   . TONSILLECTOMY      Current Outpatient Medications  Medication Sig Dispense Refill  . ALPRAZolam (XANAX) 0.25 MG tablet Take 0.25 mg by mouth at bedtime as needed. May take one  tablet once every month or two    . Ascorbic Acid (VITAMIN C) 1000 MG tablet Take 1,000 mg by mouth daily as needed (Flu season).     . Calcium Citrate-Vitamin D (CALCIUM + D PO) Take 1,200 mg by mouth daily.    . clobetasol cream (TEMOVATE) 3.47 % Apply 1 application topically 3 (three) times daily as needed (eczema).   0  . desonide (DESOWEN) 0.05 % cream Apply 1 application topically as needed. ONCE DAILY IF NEEDED  3  . diclofenac sodium (VOLTAREN) 1 % GEL Take 1 application by mouth 2 (two) times daily as needed.    . diphenhydrAMINE (BENADRYL) 25 MG tablet Take 25 mg by mouth 2 (two) times daily as needed. Patient alternates with Xyzal    . ELDERBERRY PO Take 2 tablets by mouth daily.    Marland Kitchen EPINEPHrine (EPIPEN 2-PAK) 0.3 mg/0.3 mL IJ SOAJ injection Inject 0.3 mg into the muscle once. Reported on 01/25/2015    . fish oil-omega-3 fatty acids 1000 MG capsule Take 2 g by mouth daily.     . fluticasone (FLONASE) 50 MCG/ACT nasal spray Place 2 sprays into both nostrils 2 (two) times daily as needed for allergies.    Marland Kitchen levocetirizine (XYZAL) 5 MG tablet TAKE 1 TABLET (5 MG TOTAL) BY MOUTH DAILY. 30 tablet 1  . levothyroxine (SYNTHROID, LEVOTHROID) 88 MCG tablet Take 88  mcg by mouth daily before breakfast.    . Melatonin 10 MG TABS Take 1 tablet by mouth at bedtime.     . montelukast (SINGULAIR) 10 MG tablet TAKE 1 TABLET (10 MG TOTAL) BY MOUTH AT BEDTIME. 30 tablet 0  . Multiple Vitamin (MULTIVITAMIN) capsule Take 1 capsule by mouth daily.    . Olopatadine HCl (PAZEO) 0.7 % SOLN Place 1 drop into both eyes 2 (two) times daily as needed (eye allergies).    . ondansetron (ZOFRAN) 4 MG tablet Take 1 tablet (4 mg total) by mouth every 6 (six) hours as needed for nausea. 20 tablet 0  . polyethylene glycol powder (GLYCOLAX/MIRALAX) powder MIX AND TAKE ONE TO TWO CAPFULS NIGHTLY AS NEEDED. (Patient taking differently: Take 17 g by mouth daily. ) 1054 g 2  . Potassium 95 MG TABS Take 1 tablet by mouth every  other day.     . Probiotic Product (PROBIOTIC DAILY PO) Take 1 capsule by mouth daily.     . TURMERIC PO Take 1 capsule by mouth 2 (two) times daily.    . vitamin E 400 UNIT capsule Take 400 Units by mouth daily.    Marland Kitchen zolpidem (AMBIEN) 10 MG tablet Take 5 mg by mouth at bedtime as needed for sleep.     No current facility-administered medications for this visit.    Allergies:  Sulfa antibiotics   Social History: The patient  reports that she quit smoking about 49 years ago. Her smoking use included cigarettes. She started smoking about 54 years ago. She has a 2.50 pack-year smoking history. She has never used smokeless tobacco. She reports previous alcohol use of about 1.0 standard drinks of alcohol per week. She reports that she does not use drugs.   ROS:  Please see the history of present illness. Otherwise, complete review of systems is positive for none.  All other systems are reviewed and negative.   Physical Exam: VS:  BP 126/80   Pulse 88   Ht 5\' 2"  (1.575 m)   Wt 131 lb (59.4 kg)   SpO2 97%   BMI 23.96 kg/m , BMI Body mass index is 23.96 kg/m.  Wt Readings from Last 3 Encounters:  01/16/18 131 lb (59.4 kg)  08/08/17 126 lb (57.2 kg)  07/31/17 126 lb (57.2 kg)    General: Patient appears comfortable at rest. HEENT: Conjunctiva and lids normal, oropharynx clear. Neck: Supple, no elevated JVP or carotid bruits, no thyromegaly. Lungs: Clear to auscultation, nonlabored breathing at rest. Cardiac: Regular rate and rhythm, no S3, 3/6 systolic murmur. Abdomen: Soft, nontender, bowel sounds present. Extremities: No pitting edema, distal pulses 2+. Skin: Warm and dry. Musculoskeletal: No kyphosis. Neuropsychiatric: Alert and oriented x3, affect grossly appropriate.  ECG: I personally reviewed the tracing from 07/22/2017 which showed normal sinus rhythm.  Recent Labwork: 08/09/2017: BUN 10; Creatinine, Ser 0.56; Hemoglobin 11.0; Platelets 175; Potassium 4.6; Sodium 142   Other  Studies Reviewed Today:  Exercise Cardiolite 09/06/2014:  Blood pressure demonstrated a hypertensive response to exercise.  T wave inversion was noted during stress in the V6, II, III, aVF and V5 leads, beginning at 6 minutes of stress, and returning to baseline after 5-9 mins of recovery.  This is a low risk study. Normal myocardial perfusion.  The left ventricular ejection fraction is hyperdynamic (>65%).  Nuclear stress EF: 73%.  Echocardiogram 07/25/2017: Study Conclusions  - Left ventricle: The cavity size was normal. Wall thickness was   normal. Systolic function was normal.  The estimated ejection   fraction was in the range of 60% to 65%. Wall motion was normal;   there were no regional wall motion abnormalities. Doppler   parameters are consistent with abnormal left ventricular   relaxation (grade 1 diastolic dysfunction). Doppler parameters   are consistent with indeterminate ventricular filling pressure. - Ventricular septum: Membranous VSD seen with left to right   shunting. As noted previously, visualization of the RVOT is not   adequate to allow for calculation of Qp/Qs. Right ventricular   chamber size and contraction are normal, arguing against a   significant left-to-right shunt. - Aortic valve: There was trivial regurgitation. - Mitral valve: Mildly calcified leaflets . There was moderate   regurgitation. - Left atrium: The atrium was moderately dilated. - Atrial septum: No defect or patent foramen ovale was identified. - Tricuspid valve: There was mild regurgitation.  Assessment and Plan:  1.  Membranous VSD, asymptomatic and stable by follow-up echocardiogram with no evidence of RV strain.  2.  Intermittent brief episodes of rapid heartbeat, none in the last 4 months.  We are observing at this point, if symptoms escalate further outpatient monitoring will be pursued.  3.  Mitral regurgitation, moderate by echocardiogram in June.  Current medicines were  reviewed with the patient today.  Disposition: Follow-up in 1 year, sooner if needed.  Signed, Satira Sark, MD, [facility] 01/16/2018 11:55 AM    [facility], [place], [place] [address] Phone: [phone]; Fax: [phone]

## 2018-02-07 DIAGNOSIS — Z96652 Presence of left artificial knee joint: Secondary | ICD-10-CM | POA: Diagnosis not present

## 2018-02-07 DIAGNOSIS — M25561 Pain in right knee: Secondary | ICD-10-CM | POA: Diagnosis not present

## 2018-02-07 DIAGNOSIS — Z471 Aftercare following joint replacement surgery: Secondary | ICD-10-CM | POA: Diagnosis not present

## 2018-02-13 DIAGNOSIS — H40033 Anatomical narrow angle, bilateral: Secondary | ICD-10-CM | POA: Diagnosis not present

## 2018-02-13 DIAGNOSIS — H2513 Age-related nuclear cataract, bilateral: Secondary | ICD-10-CM | POA: Diagnosis not present

## 2018-02-13 DIAGNOSIS — H40023 Open angle with borderline findings, high risk, bilateral: Secondary | ICD-10-CM | POA: Diagnosis not present

## 2018-02-13 DIAGNOSIS — H25013 Cortical age-related cataract, bilateral: Secondary | ICD-10-CM | POA: Diagnosis not present

## 2018-03-28 ENCOUNTER — Other Ambulatory Visit: Payer: Self-pay | Admitting: Family Medicine

## 2018-03-28 DIAGNOSIS — Z1231 Encounter for screening mammogram for malignant neoplasm of breast: Secondary | ICD-10-CM

## 2018-04-10 ENCOUNTER — Ambulatory Visit
Admission: RE | Admit: 2018-04-10 | Discharge: 2018-04-10 | Disposition: A | Discharge status: Home / Self Care | Payer: Medicare Other | Source: Ambulatory Visit | Attending: Family Medicine | Admitting: Family Medicine

## 2018-04-10 ENCOUNTER — Other Ambulatory Visit: Payer: Self-pay

## 2018-04-10 DIAGNOSIS — Z1231 Encounter for screening mammogram for malignant neoplasm of breast: Secondary | ICD-10-CM | POA: Diagnosis not present

## 2018-06-19 DIAGNOSIS — L820 Inflamed seborrheic keratosis: Secondary | ICD-10-CM | POA: Diagnosis not present

## 2018-06-19 DIAGNOSIS — D225 Melanocytic nevi of trunk: Secondary | ICD-10-CM | POA: Diagnosis not present

## 2018-06-19 DIAGNOSIS — Z1283 Encounter for screening for malignant neoplasm of skin: Secondary | ICD-10-CM | POA: Diagnosis not present

## 2018-06-19 DIAGNOSIS — L908 Other atrophic disorders of skin: Secondary | ICD-10-CM | POA: Diagnosis not present

## 2018-07-01 DIAGNOSIS — R5383 Other fatigue: Secondary | ICD-10-CM | POA: Diagnosis not present

## 2018-07-01 DIAGNOSIS — R002 Palpitations: Secondary | ICD-10-CM | POA: Diagnosis not present

## 2018-08-11 DIAGNOSIS — Z96652 Presence of left artificial knee joint: Secondary | ICD-10-CM | POA: Diagnosis not present

## 2018-08-11 DIAGNOSIS — Z471 Aftercare following joint replacement surgery: Secondary | ICD-10-CM | POA: Diagnosis not present

## 2018-08-18 DIAGNOSIS — H40033 Anatomical narrow angle, bilateral: Secondary | ICD-10-CM | POA: Diagnosis not present

## 2018-08-18 DIAGNOSIS — H401131 Primary open-angle glaucoma, bilateral, mild stage: Secondary | ICD-10-CM | POA: Diagnosis not present

## 2018-08-18 DIAGNOSIS — H35363 Drusen (degenerative) of macula, bilateral: Secondary | ICD-10-CM | POA: Diagnosis not present

## 2018-08-18 DIAGNOSIS — H2513 Age-related nuclear cataract, bilateral: Secondary | ICD-10-CM | POA: Diagnosis not present

## 2018-12-04 DIAGNOSIS — H40033 Anatomical narrow angle, bilateral: Secondary | ICD-10-CM | POA: Diagnosis not present

## 2018-12-04 DIAGNOSIS — H401131 Primary open-angle glaucoma, bilateral, mild stage: Secondary | ICD-10-CM | POA: Diagnosis not present

## 2018-12-04 DIAGNOSIS — H04123 Dry eye syndrome of bilateral lacrimal glands: Secondary | ICD-10-CM | POA: Diagnosis not present

## 2018-12-22 DIAGNOSIS — Z882 Allergy status to sulfonamides status: Secondary | ICD-10-CM | POA: Diagnosis not present

## 2018-12-22 DIAGNOSIS — E039 Hypothyroidism, unspecified: Secondary | ICD-10-CM | POA: Diagnosis not present

## 2018-12-22 DIAGNOSIS — Z885 Allergy status to narcotic agent status: Secondary | ICD-10-CM | POA: Diagnosis not present

## 2018-12-22 DIAGNOSIS — R202 Paresthesia of skin: Secondary | ICD-10-CM | POA: Diagnosis not present

## 2018-12-22 DIAGNOSIS — Z79899 Other long term (current) drug therapy: Secondary | ICD-10-CM | POA: Diagnosis not present

## 2018-12-29 DIAGNOSIS — G459 Transient cerebral ischemic attack, unspecified: Secondary | ICD-10-CM | POA: Diagnosis not present

## 2019-01-05 DIAGNOSIS — I6521 Occlusion and stenosis of right carotid artery: Secondary | ICD-10-CM | POA: Diagnosis not present

## 2019-01-05 DIAGNOSIS — R200 Anesthesia of skin: Secondary | ICD-10-CM | POA: Diagnosis not present

## 2019-01-05 DIAGNOSIS — G459 Transient cerebral ischemic attack, unspecified: Secondary | ICD-10-CM | POA: Diagnosis not present

## 2019-01-06 ENCOUNTER — Encounter: Payer: Self-pay | Admitting: Neurology

## 2019-01-06 ENCOUNTER — Telehealth: Payer: Self-pay | Admitting: Cardiology

## 2019-01-06 ENCOUNTER — Ambulatory Visit (INDEPENDENT_AMBULATORY_CARE_PROVIDER_SITE_OTHER): Payer: Medicare Other | Admitting: Neurology

## 2019-01-06 ENCOUNTER — Other Ambulatory Visit: Payer: Self-pay

## 2019-01-06 VITALS — BP 118/73 | HR 66 | Temp 96.8°F | Ht 62.0 in | Wt 132.5 lb

## 2019-01-06 DIAGNOSIS — R202 Paresthesia of skin: Secondary | ICD-10-CM | POA: Insufficient documentation

## 2019-01-06 NOTE — Progress Notes (Signed)
PATIENT: [name] DOB: 10-30-1950  Chief Complaint  Patient presents with  . Left Facial Numbness    Hospital follow up from 12/22/2018.  Presented with sudden onset of left-sided facial numbness.  Denies facial droop or left sided weakness.  She underwent MRI brain showing moderate chronic small vessel ischemic disease but no actue intracranial abnormalities.  Symptoms resolved without treatment.  No further episodes.  Her PCP started her on aspirin 325mg  daily.  Marland Kitchen PCP    Rory Percy, MD     HISTORICAL  [name] is a 67 years old female, seen in request by her primary care physician Dr. Rory Percy for evaluation of sudden onset left facial numbness, initial evaluation was on December 2020.  I have reviewed and summarized the referring note from the referring physician.  She had past medical history of hypothyroidism, on supplement, allergy,  On December 22, 2018, she woke up noticed left cheek numbness, but denies dysarthria, no left upper or lower extremity paresthesia or weakness, she was taken by her son to local emergency room at [facility], had a stroke evaluation, MRI of the brain showed no acute abnormality, but there was evidence of multiple small vessel disease  Laboratory evaluations normal CMP, creatinine of 0.62, CBC, hemoglobin of 12.6,  Per patient, she also had ultrasound of carotid artery Echocardiogram in June 2019, ejection fraction 60 to 65%, wall motion was normal, 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  ALLERGIES: Allergies  Allergen Reactions  . Hydrocodone Itching  . Sulfa Antibiotics Rash    HOME  MEDICATIONS: Current Outpatient Medications  Medication Sig Dispense Refill  . ALPRAZolam (XANAX) 0.25 MG tablet Take 0.25 mg by mouth at bedtime as needed. May take one tablet once every month or two    . Ascorbic Acid (VITAMIN C) 1000 MG tablet Take 1,000 mg by mouth daily as needed (Flu season).     Marland Kitchen aspirin 325 MG tablet Take 325 mg by mouth daily.    . Calcium Citrate-Vitamin D (CALCIUM + D PO) Take 1,200 mg by mouth daily.    Marland Kitchen desonide (DESOWEN) 0.05 % cream Apply 1 application topically as needed. ONCE DAILY IF NEEDED  3  . diclofenac sodium (VOLTAREN) 1 % GEL Take 1 application by mouth 2 (two) times daily as needed.    . diphenhydrAMINE (BENADRYL) 25 MG tablet Take 25 mg by mouth 2 (two) times daily as needed. Patient alternates with Xyzal    . ELDERBERRY PO Take 2 tablets by mouth daily.    Marland Kitchen EPINEPHrine (EPIPEN 2-PAK) 0.3 mg/0.3 mL IJ SOAJ injection Inject 0.3 mg into the muscle once. Reported on 01/25/2015    . estrogens, conjugated, (PREMARIN) 0.625 MG tablet Take 0.5 tablets by mouth daily.    . fish oil-omega-3 fatty acids 1000 MG capsule Take 2 g by mouth daily.     . fluticasone (FLONASE) 50 MCG/ACT nasal spray Place 2 sprays into both nostrils 2 (two) times daily as needed for allergies.    Marland Kitchen latanoprost (XALATAN) 0.005 % ophthalmic solution Place 1 drop into both eyes daily.    Marland Kitchen levocetirizine (XYZAL) 5 MG  tablet TAKE 1 TABLET (5 MG TOTAL) BY MOUTH DAILY. 30 tablet 1  . levothyroxine (SYNTHROID, LEVOTHROID) 88 MCG tablet Take 88 mcg by mouth daily before breakfast.    . Melatonin 10 MG TABS Take 1 tablet by mouth at bedtime as needed.     . montelukast (SINGULAIR) 10 MG tablet TAKE 1 TABLET (10 MG TOTAL) BY MOUTH AT BEDTIME. 30 tablet 0  . Multiple Vitamin (MULTIVITAMIN) capsule Take 1 capsule by mouth daily.    . Olopatadine HCl (PAZEO) 0.7 % SOLN Place 1 drop into both eyes 2 (two) times daily as needed (eye allergies).    . polyethylene glycol powder (GLYCOLAX/MIRALAX)  powder MIX AND TAKE ONE TO TWO CAPFULS NIGHTLY AS NEEDED. (Patient taking differently: Take 17 g by mouth daily. ) 1054 g 2  . Potassium 95 MG TABS Take 1 tablet by mouth every other day.     . Probiotic Product (PROBIOTIC DAILY PO) Take 1 capsule by mouth daily.     . TURMERIC PO Take 1 capsule by mouth 2 (two) times daily.    . vitamin E 400 UNIT capsule Take 400 Units by mouth daily.    Marland Kitchen zolpidem (AMBIEN) 10 MG tablet Take 5 mg by mouth at bedtime as needed for sleep.     No current facility-administered medications for this visit.     PAST MEDICAL HISTORY: Past Medical History:  Diagnosis Date  . Bunion   . Chronic constipation   . Diverticulosis   . History of kidney stones   . Hypothyroidism   . Left facial numbness   . Mitral regurgitation    Moderate  . Neuroma of foot   . OA (osteoarthritis)    Left knee, hands  . Seasonal allergies   . 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 Tinnitus   . Ventricular septal defect (VSD), membranous     PAST SURGICAL HISTORY: Past Surgical History:  Procedure Laterality Date  . ABDOMINAL HYSTERECTOMY    . BREAST EXCISIONAL BIOPSY Right 1996  . BREAST LUMPECTOMY Right    benign  . COLONOSCOPY   02/03/2003   RMR: Normal rectum and colon  . COLONOSCOPY N/A 03/10/2013   Dr. Gala Romney-  minimal internal hemorrhoids/ anal papilla o/w normal appearing rectum. colonic diverticulosis  . KNEE ARTHROPLASTY Left 08/08/2017   Procedure: LEFT TOTAL KNEE ARTHROPLASTY WITH COMPUTER NAVIGATION;  Surgeon: Rod Can, MD;  Location: [facility];  Service: Orthopedics;  Laterality: Left;  NEEDS RNFA  . KNEE ARTHROSCOPY Left 11/30/2016  . SHOULDER ARTHROSCOPY Left   . TONSILLECTOMY      FAMILY HISTORY: Family History  Problem Relation Age of Onset  . Hypertension Father   . Heart attack Father   . Hypertension Sister   . Parkinson's disease Mother   . Colon cancer Neg Hx     SOCIAL HISTORY: Social History   Socioeconomic  History  . Marital status: Legally Separated    Spouse name: Not on file  . Number of children: 3  . Years of education: some college  . Highest education level: Not on file  Occupational History  . Occupation: Retired    Fish farm manager: Proofreader  Social Needs  . Financial resource strain: Not on file  . Food insecurity    Worry: Not on file    Inability: Not on file  . Transportation needs    Medical: Not on file    Non-medical: Not on file  Tobacco Use  .  Smoking status: Former Smoker    Packs/day: 0.50    Years: 5.00    Pack years: 2.50    Types: Cigarettes    Start date: 05/18/1963    Quit date: 10/23/1968    Years since quitting: 50.2  . Smokeless tobacco: Never Used  . Tobacco comment: smoked in her 63s  Substance and Sexual Activity  . Alcohol use: Not Currently  . Drug use: No  . Sexual activity: Yes  Lifestyle  . Physical activity    Days per week: Not on file    Minutes per session: Not on file  . Stress: Not on file  Relationships  . Social Herbalist on phone: Not on file    Gets together: Not on file    Attends religious service: Not on file    Active member of club or organization: Not on file    Attends meetings of clubs or organizations: Not on file    Relationship status: Not on file  . Intimate partner violence    Fear of current or ex partner: Not on file    Emotionally abused: Not on file    Physically abused: Not on file    Forced sexual activity: Not on file  Other Topics Concern  . Not on file  Social History Narrative   Lives at home alone.   Right-handed.   Caffeine use:  2 cups per day.     PHYSICAL EXAM   Vitals:   01/06/19 1411  BP: 118/73  Pulse: 66  Temp: (!) 96.8 F (36 C)  Weight: 132 lb 8 oz (60.1 kg)  Height: 5\' 2"  (1.575 m)    Not recorded      Body mass index is 24.23 kg/m.  PHYSICAL EXAMNIATION:  Gen: NAD, conversant, well nourised, well groomed                     Cardiovascular: Regular rate  rhythm, no peripheral edema, warm, nontender. Eyes: Conjunctivae clear without exudates or hemorrhage Neck: Supple, no carotid bruits. Pulmonary: Clear to auscultation bilaterally   NEUROLOGICAL EXAM:  MENTAL STATUS: Speech:    Speech is normal; fluent and spontaneous with normal comprehension.  Cognition:     Orientation to time, place and person     Normal recent and remote memory     Normal Attention span and concentration     Normal Language, naming, repeating,spontaneous speech     Fund of knowledge   CRANIAL NERVES: CN II: Visual fields are full to confrontation.  . Pupils are round equal and briskly reactive to light. CN III, IV, VI: extraocular movement are normal. No ptosis. CN V: Facial sensation is intact to pinprick in all 3 divisions bilaterally. Corneal responses are intact.  CN VII: Face is symmetric with normal eye closure and smile. CN VIII: Hearing is normal to causal conversation. CN IX, X: Palate elevates symmetrically. Phonation is normal. CN XI: Head turning and shoulder shrug are intact CN XII: Tongue is midline with normal movements and no atrophy.  MOTOR: There is no pronator drift of out-stretched arms. Muscle bulk and tone are normal. Muscle strength is normal.  REFLEXES: Reflexes are 2+ and symmetric at the biceps, triceps, knees, and ankles. Plantar responses are flexor.  SENSORY: Intact to light touch, pinprick and vibratory sensation are intact in fingers and toes.  COORDINATION: There is no trunk or limb dysmetria noted.  GAIT/STANCE: Posture is normal. Gait is steady with normal steps,  base, arm swing, and turning. Heel and toe walking are normal. Tandem gait is normal.  Romberg is absent.   DIAGNOSTIC DATA (LABS, IMAGING, TESTING) - I reviewed patient records, labs, notes, testing and imaging myself where available.   ASSESSMENT AND PLAN  [name] is a 68 y.o. female    Sudden onset left cheek numbness  Likely small vessel  stroke involving right thalamus  Get medical record from [facility] aspirin 325 mg daily  Encouraged her moderate exercise,  Marcial Pacas, M.D. Ph.D.  [facility] [address], [place], [place] [address] Ph: [phone] Fax: [phone]  CC: Rory Percy, MD

## 2019-01-06 NOTE — Telephone Encounter (Signed)
Virtual Visit Pre-Appointment Phone Call  "(Name), I am calling you today to discuss your upcoming appointment. We are currently trying to limit exposure to the virus that causes COVID-19 by seeing patients at home rather than in the office."  1. "What is the BEST phone number to call the day of the visit?" -   [phone]  2. Do you have or have access to (through a family member/friend) a smartphone with video capability that we can use for your visit?" a. If yes - list this number in appt notes as cell (if different from BEST phone #) and list the appointment type as a VIDEO visit in appointment notes b. If no - list the appointment type as a PHONE visit in appointment notes  3. Confirm consent - "In the setting of the current Covid19 crisis, you are scheduled for a (phone or video) visit with your provider on (date) at (time).  Just as we do with many in-office visits, in order for you to participate in this visit, we must obtain consent.  If you'd like, I can send this to your mychart (if signed up) or email for you to review.  Otherwise, I can obtain your verbal consent now.  All virtual visits are billed to your insurance company just like a normal visit would be.  By agreeing to a virtual visit, we'd like you to understand that the technology does not allow for your provider to perform an examination, and thus may limit your provider's ability to fully assess your condition. If your provider identifies any concerns that need to be evaluated in person, we will make arrangements to do so.  Finally, though the technology is pretty good, we cannot assure that it will always work on either your or our end, and in the setting of a video visit, we may have to convert it to a phone-only visit.  In either situation, we cannot ensure that we have a secure connection.  Are you willing to proceed?" STAFF: Did the patient verbally acknowledge consent to telehealth visit? Document YES/NO here: YES    4. Advise patient to be prepared - "Two hours prior to your appointment, go ahead and check your blood pressure, pulse, oxygen saturation, and your weight (if you have the equipment to check those) and write them all down. When your visit starts, your provider will ask you for this information. If you have an Apple Watch or Kardia device, please plan to have heart rate information ready on the day of your appointment. Please have a pen and paper handy nearby the day of the visit as well."  5. Give patient instructions for MyChart download to smartphone OR [email or web address] as below if video visit (depending on what platform provider is using)  6. Inform patient they will receive a phone call 15 minutes prior to their appointment time (may be from unknown caller ID) so they should be prepared to answer    TELEPHONE CALL NOTE  [name] has been deemed a candidate for a follow-up tele-health visit to limit community exposure during the Covid-19 pandemic. I spoke with the patient via phone to ensure availability of phone/video source, confirm preferred email & phone number, and discuss instructions and expectations.  I reminded [name] to be prepared with any vital sign and/or heart rhythm information that could potentially be obtained via home monitoring, at the time of her visit. I reminded [name] to expect a phone call prior to her  visit.  [name] 01/06/2019 3:27 PM   INSTRUCTIONS FOR DOWNLOADING THE MYCHART APP TO SMARTPHONE  - The patient must first make sure to have activated MyChart and know their login information - If Apple, go to [employer] and type in MyChart in the search bar and download the app. If Android, ask patient to go to [employer] and type in [place] in the search bar and download the app. The app is free but as with any other app downloads, their phone may require them to verify saved payment information or Apple/Android password.  - The  patient will need to then log into the app with their MyChart username and password, and select [facility] as their healthcare provider to link the account. When it is time for your visit, go to the MyChart app, find appointments, and click Begin Video Visit. Be sure to Select Allow for your device to access the Microphone and Camera for your visit. You will then be connected, and your provider will be with you shortly.  **If they have any issues connecting, or need assistance please contact MyChart service desk [phone])**  **If using a computer, in order to ensure the best quality for their visit they will need to use either of the following Internet Browsers: [employer], or Google Chrome**  IF USING DOXIMITY or DOXY.ME - The patient will receive a link just prior to their visit by text.     FULL LENGTH CONSENT FOR TELE-HEALTH VISIT   I hereby voluntarily request, consent and authorize [facility] and its employed or contracted physicians, physician assistants, nurse practitioners or other licensed health care professionals (the Practitioner), to provide me with telemedicine health care services (the Services") as deemed necessary by the treating Practitioner. I acknowledge and consent to receive the Services by the Practitioner via telemedicine. I understand that the telemedicine visit will involve communicating with the Practitioner through live audiovisual communication technology and the disclosure of certain medical information by electronic transmission. I acknowledge that I have been given the opportunity to request an in-person assessment or other available alternative prior to the telemedicine visit and am voluntarily participating in the telemedicine visit.  I understand that I have the right to withhold or withdraw my consent to the use of telemedicine in the course of my care at any time, without affecting my right to future care or treatment, and that the  Practitioner or I may terminate the telemedicine visit at any time. I understand that I have the right to inspect all information obtained and/or recorded in the course of the telemedicine visit and may receive copies of available information for a reasonable fee.  I understand that some of the potential risks of receiving the Services via telemedicine include:   Delay or interruption in medical evaluation due to technological equipment failure or disruption;  Information transmitted may not be sufficient (e.g. poor resolution of images) to allow for appropriate medical decision making by the Practitioner; and/or   In rare instances, security protocols could fail, causing a breach of personal health information.  Furthermore, I acknowledge that it is my responsibility to provide information about my medical history, conditions and care that is complete and accurate to the best of my ability. I acknowledge that Practitioner's advice, recommendations, and/or decision may be based on factors not within their control, such as incomplete or inaccurate data provided by me or distortions of diagnostic images or specimens that may result from electronic transmissions. I understand  that the practice of medicine is not an exact science and that Practitioner makes no warranties or guarantees regarding treatment outcomes. I acknowledge that I will receive a copy of this consent concurrently upon execution via email to the email address I last provided but may also request a printed copy by calling the office of [facility].    I understand that my insurance will be billed for this visit.   I have read or had this consent read to me.  I understand the contents of this consent, which adequately explains the benefits and risks of the Services being provided via telemedicine.   I have been provided ample opportunity to ask questions regarding this consent and the Services and have had my questions answered to my  satisfaction.  I give my informed consent for the services to be provided through the use of telemedicine in my medical care  By participating in this telemedicine visit I agree to the above.

## 2019-01-28 ENCOUNTER — Encounter: Payer: Self-pay | Admitting: Cardiology

## 2019-01-28 ENCOUNTER — Encounter: Payer: Self-pay | Admitting: *Deleted

## 2019-01-28 ENCOUNTER — Telehealth (INDEPENDENT_AMBULATORY_CARE_PROVIDER_SITE_OTHER): Payer: Medicare Other | Admitting: Cardiology

## 2019-01-28 VITALS — BP 124/77 | HR 72 | Ht 62.0 in | Wt 129.0 lb

## 2019-01-28 DIAGNOSIS — R202 Paresthesia of skin: Secondary | ICD-10-CM

## 2019-01-28 DIAGNOSIS — I340 Nonrheumatic mitral (valve) insufficiency: Secondary | ICD-10-CM | POA: Diagnosis not present

## 2019-01-28 DIAGNOSIS — Q210 Ventricular septal defect: Secondary | ICD-10-CM | POA: Diagnosis not present

## 2019-01-28 NOTE — Patient Instructions (Signed)

## 2019-01-28 NOTE — Progress Notes (Signed)
Virtual Visit via Telephone Note   This visit type was conducted due to national recommendations for restrictions regarding the COVID-19 Pandemic (e.g. social distancing) in an effort to limit this patient's exposure and mitigate transmission in our community.  Due to her co-morbid illnesses, this patient is at least at moderate risk for complications without adequate follow up.  This format is felt to be most appropriate for this patient at this time.  The patient did not have access to video technology/had technical difficulties with video requiring transitioning to audio format only (telephone).  All issues noted in this document were discussed and addressed.  No physical exam could be performed with this format.  Please refer to the patient's chart for her  consent to telehealth for [facility].   Date:  01/28/2019   ID:  ANGELLEA SANTOSUOSSO, DOB 21-Oct-1950, MRN [id number]  Patient Location: Home Provider Location: Office  PCP:  Rory Percy, MD  Cardiologist:  Rozann Lesches, MD Electrophysiologist:  None   Evaluation Performed:  Follow-Up Visit  Chief Complaint:  Cardiac follow-up  History of Present Illness:    MACK SHAIK is a 68 y.o. female last seen in December 2019.  We spoke by phone today.  From a cardiac perspective, she does not report any palpitations or exertional chest pain.  She has been exercising, walks 3 days a week, and then swims and uses a stationary bicycle on the other 2 days.  She does not report any major change in stamina.  She did undergo a neurological evaluation earlier in December following presentation to [facility] with facial numbness.  She had a brain MRI and carotid Dopplers, results being requested.  I reviewed the note from Dr. 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  I reviewed her current medications which are listed below.  She did have an echocardiogram in June 2019,  no clear indication for repeat study at this particular time.  The patient does not have symptoms concerning for COVID-19 infection (fever, chills, cough, or new shortness of breath).    Past Medical History:  Diagnosis Date  . Bunion   . Chronic constipation   . Diverticulosis   . History of kidney stones   . Hypothyroidism   . Left facial numbness   . Mitral regurgitation    Moderate  . Neuroma of foot   . OA (osteoarthritis)    Left knee, hands  . Seasonal allergies   . 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 Tinnitus   . Ventricular septal defect (VSD), membranous    Past Surgical History:  Procedure Laterality Date  . ABDOMINAL HYSTERECTOMY    . BREAST EXCISIONAL BIOPSY Right 1996  . BREAST LUMPECTOMY Right    benign  . COLONOSCOPY   02/03/2003   RMR: Normal rectum and colon  . COLONOSCOPY N/A 03/10/2013   Dr. Gala Romney-  minimal internal hemorrhoids/ anal papilla o/w normal appearing rectum. colonic diverticulosis  . KNEE ARTHROPLASTY Left 08/08/2017   Procedure: LEFT TOTAL KNEE ARTHROPLASTY WITH COMPUTER NAVIGATION;  Surgeon: Rod Can, MD;  Location: [facility];  Service: Orthopedics;  Laterality: Left;  NEEDS RNFA  . KNEE ARTHROSCOPY Left 11/30/2016  . SHOULDER ARTHROSCOPY Left   . TONSILLECTOMY       Current Meds  Medication Sig  . ALPRAZolam (XANAX) 0.25 MG tablet Take 0.25 mg by mouth at bedtime as needed. May take one tablet once  every month or two  . Ascorbic Acid (VITAMIN C) 1000 MG tablet Take 1,000 mg by mouth daily as needed (Flu season).   Marland Kitchen aspirin 325 MG tablet Take 325 mg by mouth daily.  . Calcium Citrate-Vitamin D (CALCIUM + D PO) Take 1,200 mg by mouth daily.  Marland Kitchen desonide (DESOWEN) 0.05 % cream Apply 1 application topically as needed. ONCE DAILY IF NEEDED  . diclofenac sodium (VOLTAREN) 1 % GEL Take 1 application by mouth 2 (two) times daily as needed.  . diphenhydrAMINE (BENADRYL) 25 MG tablet Take 25 mg by mouth 2 (two) times  daily as needed. Patient alternates with Xyzal  . ELDERBERRY PO Take 2 tablets by mouth daily.  Marland Kitchen EPINEPHrine (EPIPEN 2-PAK) 0.3 mg/0.3 mL IJ SOAJ injection Inject 0.3 mg into the muscle once. Reported on 01/25/2015  . estrogens, conjugated, (PREMARIN) 0.625 MG tablet Take 0.5 tablets by mouth daily.  . fish oil-omega-3 fatty acids 1000 MG capsule Take 2 g by mouth daily.   . fluticasone (FLONASE) 50 MCG/ACT nasal spray Place 2 sprays into both nostrils 2 (two) times daily as needed for allergies.  Marland Kitchen latanoprost (XALATAN) 0.005 % ophthalmic solution Place 1 drop into both eyes daily.  Marland Kitchen levocetirizine (XYZAL) 5 MG tablet TAKE 1 TABLET (5 MG TOTAL) BY MOUTH DAILY.  Marland Kitchen levothyroxine (SYNTHROID, LEVOTHROID) 88 MCG tablet Take 88 mcg by mouth daily before breakfast.  . Melatonin 10 MG TABS Take 1 tablet by mouth at bedtime as needed.   . montelukast (SINGULAIR) 10 MG tablet TAKE 1 TABLET (10 MG TOTAL) BY MOUTH AT BEDTIME.  . Multiple Vitamin (MULTIVITAMIN) capsule Take 1 capsule by mouth daily.  . Olopatadine HCl (PAZEO) 0.7 % SOLN Place 1 drop into both eyes 2 (two) times daily as needed (eye allergies).  . polyethylene glycol powder (GLYCOLAX/MIRALAX) powder MIX AND TAKE ONE TO TWO CAPFULS NIGHTLY AS NEEDED. (Patient taking differently: Take 17 g by mouth daily. )  . Potassium 95 MG TABS Take 1 tablet by mouth every other day.   . Probiotic Product (PROBIOTIC DAILY PO) Take 1 capsule by mouth daily.   . TURMERIC PO Take 1 capsule by mouth 2 (two) times daily.  . vitamin E 400 UNIT capsule Take 400 Units by mouth daily.  Marland Kitchen zolpidem (AMBIEN) 10 MG tablet Take 5 mg by mouth at bedtime as needed for sleep.     Allergies:   Hydrocodone and Sulfa antibiotics   Social History   Tobacco Use  . Smoking status: Former Smoker    Packs/day: 0.50    Years: 5.00    Pack years: 2.50    Types: Cigarettes    Start date: 05/18/1963    Quit date: 10/23/1968    Years since quitting: 50.2  . Smokeless  tobacco: Never Used  . Tobacco comment: smoked in her 44s  Substance Use Topics  . Alcohol use: Not Currently  . Drug use: No     Family Hx: The patient's family history includes Heart attack in her father; Hypertension in her father and sister; Parkinson's disease in her mother. There is no history of Colon cancer.  ROS:   Please see the history of present illness.    Recent adjustments in thyroid medication. All other systems reviewed and are negative.   Prior CV studies:   The following studies were reviewed today:  Exercise Cardiolite 09/06/2014:  Blood pressure demonstrated a hypertensive response to exercise.  T wave inversion was noted during stress in the V6, II,  III, aVF and V5 leads, beginning at 6 minutes of stress, and returning to baseline after 5-9 mins of recovery.  This is a low risk study. Normal myocardial perfusion.  The left ventricular ejection fraction is hyperdynamic (>65%).  Nuclear stress EF: 73%.  Echocardiogram 07/25/2017: Study Conclusions  - Left ventricle: The cavity size was normal. Wall thickness was normal. Systolic function was normal. The estimated ejection fraction was in the range of 60% to 65%. Wall motion was normal; there were no regional wall motion abnormalities. Doppler parameters are consistent with abnormal left ventricular relaxation (grade 1 diastolic dysfunction). Doppler parameters are consistent with indeterminate ventricular filling pressure. - Ventricular septum: Membranous VSD seen with left to right shunting. As noted previously, visualization of the RVOT is not adequate to allow for calculation of Qp/Qs. Right ventricular chamber size and contraction are normal, arguing against a significant left-to-right shunt. - Aortic valve: There was trivial regurgitation. - Mitral valve: Mildly calcified leaflets . There was moderate regurgitation. - Left atrium: The atrium was moderately dilated. -  Atrial septum: No defect or patent foramen ovale was identified. - Tricuspid valve: There was mild regurgitation.  Labs/Other Tests and Data Reviewed:    EKG:  An ECG dated 07/22/2017 was personally reviewed today and demonstrated:  Normal sinus rhythm.  Recent Labs:  08/09/2017: BUN 10; Creatinine, Ser 0.56; Hemoglobin 11.0; Platelets 175; Potassium 4.6; Sodium 142   Wt Readings from Last 3 Encounters:  01/28/19 129 lb (58.5 kg)  01/06/19 132 lb 8 oz (60.1 kg)  01/16/18 131 lb (59.4 kg)     Objective:    Vital Signs:  BP 124/77   Pulse 72   Ht 5\' 2"  (1.575 m)   Wt 129 lb (58.5 kg)   BMI 23.59 kg/m    Patient spoke in full sentences, not short of breath. No audible wheezing or coughing. Speech pattern normal.  ASSESSMENT & PLAN:    1.  Membranous VSD by prior work-up.  Echocardiogram from June 2019 reviewed above.  We will continue with observation at this time.  2.  Mitral regurgitation, moderate by last echocardiogram.  We will schedule next visit in person with repeat examination and consideration of a follow-up echocardiogram if needed.  3.  Recent neurological event as described above.  She is now on full dose aspirin with follow-up by neurology.  We are requesting her recent ECG, carotid Dopplers, and brain MRI from [facility].  COVID-19 Education: The signs and symptoms of COVID-19 were discussed with the patient and how to seek care for testing (follow up with PCP or arrange E-visit).  The importance of social distancing was discussed today.  Time:   Today, I have spent 10 minutes with the patient with telehealth technology discussing the above problems.     Medication Adjustments/Labs and Tests Ordered: Current medicines are reviewed at length with the patient today.  Concerns regarding medicines are outlined above.   Tests Ordered: No orders of the defined types were placed in this encounter.   Medication Changes: No orders of the defined types were  placed in this encounter.   Follow Up:  In Person 1 year in the [place] office.  Signed, Rozann Lesches, MD  01/28/2019 12:03 PM    [place]

## 2019-02-26 DIAGNOSIS — J019 Acute sinusitis, unspecified: Secondary | ICD-10-CM | POA: Diagnosis not present

## 2019-02-26 DIAGNOSIS — Z20828 Contact with and (suspected) exposure to other viral communicable diseases: Secondary | ICD-10-CM | POA: Diagnosis not present

## 2019-03-06 DIAGNOSIS — Z23 Encounter for immunization: Secondary | ICD-10-CM | POA: Diagnosis not present

## 2019-03-30 ENCOUNTER — Other Ambulatory Visit: Payer: Self-pay | Admitting: Family Medicine

## 2019-03-30 DIAGNOSIS — Z1231 Encounter for screening mammogram for malignant neoplasm of breast: Secondary | ICD-10-CM

## 2019-04-03 DIAGNOSIS — Z23 Encounter for immunization: Secondary | ICD-10-CM | POA: Diagnosis not present

## 2019-04-14 ENCOUNTER — Ambulatory Visit: Payer: Medicare Other | Admitting: Adult Health

## 2019-04-15 ENCOUNTER — Ambulatory Visit: Payer: Medicare Other

## 2019-04-17 DIAGNOSIS — E038 Other specified hypothyroidism: Secondary | ICD-10-CM | POA: Diagnosis not present

## 2019-04-17 DIAGNOSIS — R5383 Other fatigue: Secondary | ICD-10-CM | POA: Diagnosis not present

## 2019-04-30 ENCOUNTER — Ambulatory Visit
Admission: RE | Admit: 2019-04-30 | Discharge: 2019-04-30 | Disposition: A | Discharge status: Home / Self Care | Payer: Medicare Other | Source: Ambulatory Visit | Attending: Family Medicine | Admitting: Family Medicine

## 2019-04-30 ENCOUNTER — Other Ambulatory Visit: Payer: Self-pay

## 2019-04-30 DIAGNOSIS — Z1231 Encounter for screening mammogram for malignant neoplasm of breast: Secondary | ICD-10-CM | POA: Diagnosis not present

## 2019-06-11 DIAGNOSIS — L821 Other seborrheic keratosis: Secondary | ICD-10-CM | POA: Diagnosis not present

## 2019-06-11 DIAGNOSIS — Z1283 Encounter for screening for malignant neoplasm of skin: Secondary | ICD-10-CM | POA: Diagnosis not present

## 2019-06-24 DIAGNOSIS — N809 Endometriosis, unspecified: Secondary | ICD-10-CM | POA: Diagnosis not present

## 2019-06-24 DIAGNOSIS — R140 Abdominal distension (gaseous): Secondary | ICD-10-CM | POA: Diagnosis not present

## 2019-06-24 DIAGNOSIS — Z1331 Encounter for screening for depression: Secondary | ICD-10-CM | POA: Diagnosis not present

## 2019-06-24 DIAGNOSIS — K5900 Constipation, unspecified: Secondary | ICD-10-CM | POA: Diagnosis not present

## 2019-06-24 DIAGNOSIS — Z01419 Encounter for gynecological examination (general) (routine) without abnormal findings: Secondary | ICD-10-CM | POA: Diagnosis not present

## 2019-06-24 DIAGNOSIS — R6881 Early satiety: Secondary | ICD-10-CM | POA: Diagnosis not present

## 2019-07-01 DIAGNOSIS — E7800 Pure hypercholesterolemia, unspecified: Secondary | ICD-10-CM | POA: Diagnosis not present

## 2019-07-01 DIAGNOSIS — Z79899 Other long term (current) drug therapy: Secondary | ICD-10-CM | POA: Diagnosis not present

## 2019-07-01 DIAGNOSIS — G459 Transient cerebral ischemic attack, unspecified: Secondary | ICD-10-CM | POA: Diagnosis not present

## 2019-07-01 DIAGNOSIS — R5383 Other fatigue: Secondary | ICD-10-CM | POA: Diagnosis not present

## 2019-07-01 DIAGNOSIS — R011 Cardiac murmur, unspecified: Secondary | ICD-10-CM | POA: Diagnosis not present

## 2019-07-08 DIAGNOSIS — M810 Age-related osteoporosis without current pathological fracture: Secondary | ICD-10-CM | POA: Diagnosis not present

## 2019-07-08 DIAGNOSIS — R011 Cardiac murmur, unspecified: Secondary | ICD-10-CM | POA: Diagnosis not present

## 2019-07-08 DIAGNOSIS — I350 Nonrheumatic aortic (valve) stenosis: Secondary | ICD-10-CM | POA: Diagnosis not present

## 2019-07-08 DIAGNOSIS — M545 Low back pain: Secondary | ICD-10-CM | POA: Diagnosis not present

## 2019-07-08 DIAGNOSIS — Q210 Ventricular septal defect: Secondary | ICD-10-CM | POA: Diagnosis not present

## 2019-07-08 DIAGNOSIS — M79643 Pain in unspecified hand: Secondary | ICD-10-CM | POA: Diagnosis not present

## 2019-07-08 DIAGNOSIS — Z6824 Body mass index (BMI) 24.0-24.9, adult: Secondary | ICD-10-CM | POA: Diagnosis not present

## 2019-07-09 DIAGNOSIS — K5900 Constipation, unspecified: Secondary | ICD-10-CM | POA: Diagnosis not present

## 2019-07-09 DIAGNOSIS — R140 Abdominal distension (gaseous): Secondary | ICD-10-CM | POA: Diagnosis not present

## 2019-07-09 DIAGNOSIS — N809 Endometriosis, unspecified: Secondary | ICD-10-CM | POA: Diagnosis not present

## 2019-07-09 DIAGNOSIS — R6881 Early satiety: Secondary | ICD-10-CM | POA: Diagnosis not present

## 2019-07-15 DIAGNOSIS — M25562 Pain in left knee: Secondary | ICD-10-CM | POA: Diagnosis not present

## 2019-07-15 DIAGNOSIS — M79643 Pain in unspecified hand: Secondary | ICD-10-CM | POA: Diagnosis not present

## 2019-08-24 DIAGNOSIS — M720 Palmar fascial fibromatosis [Dupuytren]: Secondary | ICD-10-CM | POA: Diagnosis not present

## 2019-08-24 DIAGNOSIS — J019 Acute sinusitis, unspecified: Secondary | ICD-10-CM | POA: Diagnosis not present

## 2019-09-02 DIAGNOSIS — H2512 Age-related nuclear cataract, left eye: Secondary | ICD-10-CM | POA: Diagnosis not present

## 2019-09-02 DIAGNOSIS — H401131 Primary open-angle glaucoma, bilateral, mild stage: Secondary | ICD-10-CM | POA: Diagnosis not present

## 2019-09-02 DIAGNOSIS — H40033 Anatomical narrow angle, bilateral: Secondary | ICD-10-CM | POA: Diagnosis not present

## 2019-09-02 DIAGNOSIS — H25013 Cortical age-related cataract, bilateral: Secondary | ICD-10-CM | POA: Diagnosis not present

## 2019-09-02 DIAGNOSIS — H2513 Age-related nuclear cataract, bilateral: Secondary | ICD-10-CM | POA: Diagnosis not present

## 2019-11-03 DIAGNOSIS — H2512 Age-related nuclear cataract, left eye: Secondary | ICD-10-CM | POA: Diagnosis not present

## 2019-11-03 DIAGNOSIS — H25812 Combined forms of age-related cataract, left eye: Secondary | ICD-10-CM | POA: Diagnosis not present

## 2019-11-18 DIAGNOSIS — H25011 Cortical age-related cataract, right eye: Secondary | ICD-10-CM | POA: Diagnosis not present

## 2019-11-18 DIAGNOSIS — H2511 Age-related nuclear cataract, right eye: Secondary | ICD-10-CM | POA: Diagnosis not present

## 2019-11-23 DIAGNOSIS — J0101 Acute recurrent maxillary sinusitis: Secondary | ICD-10-CM | POA: Diagnosis not present

## 2019-11-23 DIAGNOSIS — Z20828 Contact with and (suspected) exposure to other viral communicable diseases: Secondary | ICD-10-CM | POA: Diagnosis not present

## 2019-11-23 DIAGNOSIS — H6592 Unspecified nonsuppurative otitis media, left ear: Secondary | ICD-10-CM | POA: Diagnosis not present

## 2019-12-01 DIAGNOSIS — H2511 Age-related nuclear cataract, right eye: Secondary | ICD-10-CM | POA: Diagnosis not present

## 2019-12-01 DIAGNOSIS — H25811 Combined forms of age-related cataract, right eye: Secondary | ICD-10-CM | POA: Diagnosis not present

## 2019-12-01 DIAGNOSIS — H25011 Cortical age-related cataract, right eye: Secondary | ICD-10-CM | POA: Diagnosis not present

## 2019-12-22 DIAGNOSIS — H6980 Other specified disorders of Eustachian tube, unspecified ear: Secondary | ICD-10-CM | POA: Diagnosis not present

## 2019-12-22 DIAGNOSIS — Z6823 Body mass index (BMI) 23.0-23.9, adult: Secondary | ICD-10-CM | POA: Diagnosis not present

## 2020-01-20 DIAGNOSIS — I350 Nonrheumatic aortic (valve) stenosis: Secondary | ICD-10-CM | POA: Diagnosis not present

## 2020-01-20 DIAGNOSIS — E7800 Pure hypercholesterolemia, unspecified: Secondary | ICD-10-CM | POA: Diagnosis not present

## 2020-01-20 DIAGNOSIS — G459 Transient cerebral ischemic attack, unspecified: Secondary | ICD-10-CM | POA: Diagnosis not present

## 2020-01-20 DIAGNOSIS — E039 Hypothyroidism, unspecified: Secondary | ICD-10-CM | POA: Diagnosis not present

## 2020-01-20 DIAGNOSIS — R011 Cardiac murmur, unspecified: Secondary | ICD-10-CM | POA: Diagnosis not present

## 2020-01-27 DIAGNOSIS — Z0000 Encounter for general adult medical examination without abnormal findings: Secondary | ICD-10-CM | POA: Diagnosis not present

## 2020-01-27 DIAGNOSIS — Q210 Ventricular septal defect: Secondary | ICD-10-CM | POA: Diagnosis not present

## 2020-01-27 DIAGNOSIS — I350 Nonrheumatic aortic (valve) stenosis: Secondary | ICD-10-CM | POA: Diagnosis not present

## 2020-01-27 DIAGNOSIS — E785 Hyperlipidemia, unspecified: Secondary | ICD-10-CM | POA: Diagnosis not present

## 2020-01-27 DIAGNOSIS — Z6824 Body mass index (BMI) 24.0-24.9, adult: Secondary | ICD-10-CM | POA: Diagnosis not present

## 2020-01-27 DIAGNOSIS — R011 Cardiac murmur, unspecified: Secondary | ICD-10-CM | POA: Diagnosis not present

## 2020-01-27 DIAGNOSIS — M79643 Pain in unspecified hand: Secondary | ICD-10-CM | POA: Diagnosis not present

## 2020-01-27 DIAGNOSIS — E039 Hypothyroidism, unspecified: Secondary | ICD-10-CM | POA: Diagnosis not present

## 2020-02-10 ENCOUNTER — Encounter: Payer: Self-pay | Admitting: Cardiology

## 2020-02-10 ENCOUNTER — Ambulatory Visit (INDEPENDENT_AMBULATORY_CARE_PROVIDER_SITE_OTHER): Payer: Medicare Other | Admitting: Cardiology

## 2020-02-10 VITALS — BP 128/82 | HR 56 | Ht 62.0 in | Wt 128.0 lb

## 2020-02-10 DIAGNOSIS — I340 Nonrheumatic mitral (valve) insufficiency: Secondary | ICD-10-CM | POA: Diagnosis not present

## 2020-02-10 DIAGNOSIS — Q210 Ventricular septal defect: Secondary | ICD-10-CM | POA: Diagnosis not present

## 2020-02-10 NOTE — Progress Notes (Signed)
Cardiology Office Note  Date: 02/10/2020   ID: [name], DOB 1950/04/23, MRN [id number]  PCP:  Rosalee Kaufman PA-C  Cardiologist:  Rozann Lesches, MD Electrophysiologist:  None   Chief Complaint  Patient presents with  . Cardiac follow-up    History of Present Illness: [name] is a 70 y.o. female last assessed via telehealth encounter in December 2020.  She presents for a routine visit.  Overall no change in stamina, no increasing shortness of breath, no palpitations or syncope.  I personally reviewed her ECG today which shows sinus rhythm with occasional PVCs, increased voltage.  She had cataract surgery in November 2021, was found to be bradycardic at that time, I reviewed the ECG which showed sinus bradycardia at 47 bpm.  She tends to run a slow heart rate and is not symptomatic with this.  We discussed getting an updated echocardiogram, mainly to follow-up on degree of mitral regurgitation, membranous VSD has been stable over the years.  Past Medical History:  Diagnosis Date  . Bunion   . Chronic constipation   . Diverticulosis   . History of kidney stones   . Hypothyroidism   . Left facial numbness   . Mitral regurgitation    Moderate  . Neuroma of foot   . OA (osteoarthritis)    Left knee, hands  . Seasonal allergies   . 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 Tinnitus   . Ventricular septal defect (VSD), membranous     Past Surgical History:  Procedure Laterality Date  . ABDOMINAL HYSTERECTOMY    . BREAST EXCISIONAL BIOPSY Right 1996  . BREAST LUMPECTOMY Right    benign  . COLONOSCOPY   02/03/2003   RMR: Normal rectum and colon  . COLONOSCOPY N/A 03/10/2013   Dr. Gala Romney-  minimal internal hemorrhoids/ anal papilla o/w normal appearing rectum. colonic diverticulosis  . KNEE ARTHROPLASTY Left 08/08/2017   Procedure: LEFT TOTAL KNEE ARTHROPLASTY WITH COMPUTER NAVIGATION;  Surgeon: Rod Can, MD;  Location: [facility];   Service: Orthopedics;  Laterality: Left;  NEEDS RNFA  . KNEE ARTHROSCOPY Left 11/30/2016  . SHOULDER ARTHROSCOPY Left   . TONSILLECTOMY      Current Outpatient Medications  Medication Sig Dispense Refill  . ALPRAZolam (XANAX) 0.25 MG tablet Take 0.25 mg by mouth at bedtime as needed. May take one tablet once every month or two    . Ascorbic Acid (VITAMIN C) 1000 MG tablet Take 1,000 mg by mouth daily as needed (Flu season).     Marland Kitchen desonide (DESOWEN) 0.05 % cream Apply 1 application topically as needed. ONCE DAILY IF NEEDED  3  . diclofenac sodium (VOLTAREN) 1 % GEL Take 1 application by mouth 2 (two) times daily as needed.    . diphenhydrAMINE (BENADRYL) 25 MG tablet Take 25 mg by mouth 2 (two) times daily as needed. Patient alternates with Xyzal    . ELDERBERRY PO Take 2 tablets by mouth daily.    Marland Kitchen EPINEPHrine 0.3 mg/0.3 mL IJ SOAJ injection Inject 0.3 mg into the muscle once. Reported on 01/25/2015    . estrogens, conjugated, (PREMARIN) 0.625 MG tablet Take 0.5 tablets by mouth daily.    . fish oil-omega-3 fatty acids 1000 MG capsule Take 2 g by mouth daily.     . fluticasone (FLONASE) 50 MCG/ACT nasal spray Place 2 sprays into both nostrils 2 (two) times daily as needed for allergies.    Marland Kitchen latanoprost (XALATAN) 0.005 %  ophthalmic solution Place 1 drop into both eyes daily.    Marland Kitchen levocetirizine (XYZAL) 5 MG tablet TAKE 1 TABLET (5 MG TOTAL) BY MOUTH DAILY. 30 tablet 1  . levothyroxine (SYNTHROID) 100 MCG tablet Take 100 mcg by mouth every other day.    . levothyroxine (SYNTHROID, LEVOTHROID) 88 MCG tablet Take 88 mcg by mouth every other day. ALTERNATING WITH 100MCG    . Melatonin 10 MG TABS Take 1 tablet by mouth at bedtime as needed.     . montelukast (SINGULAIR) 10 MG tablet TAKE 1 TABLET (10 MG TOTAL) BY MOUTH AT BEDTIME. 30 tablet 0  . Multiple Vitamin (MULTIVITAMIN) capsule Take 1 capsule by mouth daily.    . Olopatadine HCl 0.7 % SOLN Place 1 drop into both eyes 2 (two) times daily  as needed (eye allergies).    . polyethylene glycol powder (GLYCOLAX/MIRALAX) powder MIX AND TAKE ONE TO TWO CAPFULS NIGHTLY AS NEEDED. (Patient taking differently: Take 17 g by mouth daily.) 1054 g 2  . Potassium 95 MG TABS Take 1 tablet by mouth every other day.     . Probiotic Product (PROBIOTIC DAILY PO) Take 1 capsule by mouth daily.     . TURMERIC PO Take 1 capsule by mouth 2 (two) times daily.    . vitamin E 400 UNIT capsule Take 400 Units by mouth daily.    Marland Kitchen zinc gluconate 50 MG tablet Take 50 mg by mouth daily.    Marland Kitchen zolpidem (AMBIEN) 10 MG tablet Take 5 mg by mouth at bedtime as needed for sleep.     No current facility-administered medications for this visit.   Allergies:  Hydrocodone and Sulfa antibiotics   ROS: No palpitations or syncope.  Physical Exam: VS:  BP 128/82   Pulse (!) 56   Ht 5\' 2"  (1.575 m)   Wt 128 lb (58.1 kg)   SpO2 96%   BMI 23.41 kg/m , BMI Body mass index is 23.41 kg/m.  Wt Readings from Last 3 Encounters:  02/10/20 128 lb (58.1 kg)  01/28/19 129 lb (58.5 kg)  01/06/19 132 lb 8 oz (60.1 kg)    General: Patient appears comfortable at rest. HEENT: Conjunctiva and lids normal, wearing a mask. Neck: Supple, no elevated JVP or carotid bruits, no thyromegaly. Lungs: Clear to auscultation, nonlabored breathing at rest. Cardiac: Regular rate and rhythm, no S3, 3/6 harsh basal systolic murmur, no pericardial rub. Extremities: No pitting edema, distal pulses 2+.  ECG:  An ECG dated 07/22/2017 was personally reviewed today and demonstrated:  Normal sinus rhythm.  Recent Labwork:  November 2020: Hemoglobin 12.6, platelets 205, BUN 8, creatinine 0.62, potassium 4.1  Other Studies Reviewed Today:  Exercise Cardiolite 09/06/2014:  Blood pressure demonstrated a hypertensive response to exercise.  T wave inversion was noted during stress in the V6, II, III, aVF and V5 leads, beginning at 6 minutes of stress, and returning to baseline after 5-9 mins of  recovery.  This is a low risk study. Normal myocardial perfusion.  The left ventricular ejection fraction is hyperdynamic (>65%).  Nuclear stress EF: 73%.  Echocardiogram6/27/2019: Study Conclusions  - Left ventricle: The cavity size was normal. Wall thickness was normal. Systolic function was normal. The estimated ejection fraction was in the range of 60% to 65%. Wall motion was normal; there were no regional wall motion abnormalities. Doppler parameters are consistent with abnormal left ventricular relaxation (grade 1 diastolic dysfunction). Doppler parameters are consistent with indeterminate ventricular filling pressure. - Ventricular septum: Membranous  VSD seen with left to right shunting. As noted previously, visualization of the RVOT is not adequate to allow for calculation of Qp/Qs. Right ventricular chamber size and contraction are normal, arguing against a significant left-to-right shunt. - Aortic valve: There was trivial regurgitation. - Mitral valve: Mildly calcified leaflets . There was moderate regurgitation. - Left atrium: The atrium was moderately dilated. - Atrial septum: No defect or patent foramen ovale was identified. - Tricuspid valve: There was mild regurgitation.  Assessment and Plan:  1. Membranous VSD.  This has been stable over time by echocardiogram, murmur without significant change.  2. Mitral regurgitation, moderate by echocardiogram in 2019.  Follow-up study will be obtained for surveillance.  Medication Adjustments/Labs and Tests Ordered: Current medicines are reviewed at length with the patient today.  Concerns regarding medicines are outlined above.   Tests Ordered: Orders Placed This Encounter  Procedures  . EKG 12-Lead  . ECHOCARDIOGRAM COMPLETE    Medication Changes: No orders of the defined types were placed in this encounter.   Disposition:  Follow up 1 year in the [place] office.  Signed, Satira Sark, MD, [facility] 02/10/2020 1:59 PM    [place] [facility] [place], [place], [place] [address] Phone: [phone]; Fax: [phone]

## 2020-02-10 NOTE — Patient Instructions (Signed)
Medication Instructions:  ?Your physician recommends that you continue on your current medications as directed. Please refer to the Current Medication list given to you today.  ? ?Labwork: ?none ? ?Testing/Procedures: ?Your physician has requested that you have an echocardiogram. Echocardiography is a painless test that uses sound waves to create images of your heart. It provides your doctor with information about the size and shape of your heart and how well your heart?s chambers and valves are working. This procedure takes approximately one hour. There are no restrictions for this procedure.  ? ?Follow-Up: ?Your physician recommends that you schedule a follow-up appointment in: 1 year ? ?Any Other Special Instructions Will Be Listed Below (If Applicable). ? ?If you need a refill on your cardiac medications before your next appointment, please call your pharmacy. ?

## 2020-02-11 DIAGNOSIS — L853 Xerosis cutis: Secondary | ICD-10-CM | POA: Diagnosis not present

## 2020-02-11 DIAGNOSIS — D225 Melanocytic nevi of trunk: Secondary | ICD-10-CM | POA: Diagnosis not present

## 2020-02-11 DIAGNOSIS — Z1283 Encounter for screening for malignant neoplasm of skin: Secondary | ICD-10-CM | POA: Diagnosis not present

## 2020-02-11 DIAGNOSIS — L820 Inflamed seborrheic keratosis: Secondary | ICD-10-CM | POA: Diagnosis not present

## 2020-03-10 DIAGNOSIS — L308 Other specified dermatitis: Secondary | ICD-10-CM | POA: Diagnosis not present

## 2020-03-16 ENCOUNTER — Ambulatory Visit (INDEPENDENT_AMBULATORY_CARE_PROVIDER_SITE_OTHER): Payer: Medicare Other

## 2020-03-16 DIAGNOSIS — Q210 Ventricular septal defect: Secondary | ICD-10-CM | POA: Diagnosis not present

## 2020-03-16 DIAGNOSIS — I340 Nonrheumatic mitral (valve) insufficiency: Secondary | ICD-10-CM

## 2020-03-16 LAB — ECHOCARDIOGRAM COMPLETE
AR max vel: 1.25 cm2
AV Area VTI: 1.56 cm2
AV Area mean vel: 1.41 cm2
AV Mean grad: 4.9 mmHg
AV Peak grad: 11.6 mmHg
Ao pk vel: 1.7 m/s
Area-P 1/2: 3.46 cm2
Calc EF: 67.7 %
MV M vel: 4.17 m/s
MV Peak grad: 69.4 mmHg
S' Lateral: 2.65 cm
Single Plane A2C EF: 71 %
Single Plane A4C EF: 65.2 %

## 2020-03-17 ENCOUNTER — Telehealth: Payer: Self-pay | Admitting: *Deleted

## 2020-03-17 NOTE — Telephone Encounter (Signed)
-----   Message from Satira Sark, MD sent at 03/16/2020  4:31 PM EST ----- Results reviewed.  Small perimembranous VSD is stable as would be suspected.  Mitral regurgitation is only mild at this point.  Continue with current follow-up plan.

## 2020-03-17 NOTE — Telephone Encounter (Signed)
Patient informed. Copy sent to PCP °

## 2020-03-24 DIAGNOSIS — G459 Transient cerebral ischemic attack, unspecified: Secondary | ICD-10-CM | POA: Diagnosis not present

## 2020-03-24 DIAGNOSIS — L308 Other specified dermatitis: Secondary | ICD-10-CM | POA: Diagnosis not present

## 2020-03-24 DIAGNOSIS — D485 Neoplasm of uncertain behavior of skin: Secondary | ICD-10-CM | POA: Diagnosis not present

## 2020-03-29 DIAGNOSIS — M791 Myalgia, unspecified site: Secondary | ICD-10-CM | POA: Diagnosis not present

## 2020-03-29 DIAGNOSIS — J019 Acute sinusitis, unspecified: Secondary | ICD-10-CM | POA: Diagnosis not present

## 2020-03-29 DIAGNOSIS — Z20828 Contact with and (suspected) exposure to other viral communicable diseases: Secondary | ICD-10-CM | POA: Diagnosis not present

## 2020-04-06 ENCOUNTER — Other Ambulatory Visit: Payer: Self-pay | Admitting: Physician Assistant

## 2020-04-06 DIAGNOSIS — Z1231 Encounter for screening mammogram for malignant neoplasm of breast: Secondary | ICD-10-CM

## 2020-05-30 ENCOUNTER — Ambulatory Visit
Admission: RE | Admit: 2020-05-30 | Discharge: 2020-05-30 | Disposition: A | Discharge status: Home / Self Care | Payer: Medicare Other | Source: Ambulatory Visit | Attending: Physician Assistant | Admitting: Physician Assistant

## 2020-05-30 ENCOUNTER — Other Ambulatory Visit: Payer: Self-pay

## 2020-05-30 DIAGNOSIS — Z1231 Encounter for screening mammogram for malignant neoplasm of breast: Secondary | ICD-10-CM

## 2020-06-07 ENCOUNTER — Telehealth: Payer: Self-pay

## 2020-06-07 NOTE — Telephone Encounter (Signed)
Pt called office requesting to schedule OV. She was unaware she already has OV 09/28/20. Not sure if she can wait that long. Please add to cancellation list.

## 2020-06-10 NOTE — Telephone Encounter (Signed)
Added to cancellation list 

## 2020-06-30 DIAGNOSIS — H35362 Drusen (degenerative) of macula, left eye: Secondary | ICD-10-CM | POA: Diagnosis not present

## 2020-06-30 DIAGNOSIS — H401131 Primary open-angle glaucoma, bilateral, mild stage: Secondary | ICD-10-CM | POA: Diagnosis not present

## 2020-06-30 DIAGNOSIS — H04123 Dry eye syndrome of bilateral lacrimal glands: Secondary | ICD-10-CM | POA: Diagnosis not present

## 2020-06-30 DIAGNOSIS — H35363 Drusen (degenerative) of macula, bilateral: Secondary | ICD-10-CM | POA: Diagnosis not present

## 2020-07-25 DIAGNOSIS — Z6824 Body mass index (BMI) 24.0-24.9, adult: Secondary | ICD-10-CM | POA: Diagnosis not present

## 2020-07-25 DIAGNOSIS — M25512 Pain in left shoulder: Secondary | ICD-10-CM | POA: Diagnosis not present

## 2020-07-25 DIAGNOSIS — H6121 Impacted cerumen, right ear: Secondary | ICD-10-CM | POA: Diagnosis not present

## 2020-07-29 DIAGNOSIS — M6281 Muscle weakness (generalized): Secondary | ICD-10-CM | POA: Diagnosis not present

## 2020-07-29 DIAGNOSIS — M25612 Stiffness of left shoulder, not elsewhere classified: Secondary | ICD-10-CM | POA: Diagnosis not present

## 2020-07-29 DIAGNOSIS — M25512 Pain in left shoulder: Secondary | ICD-10-CM | POA: Diagnosis not present

## 2020-08-02 ENCOUNTER — Ambulatory Visit: Payer: Medicare Other | Admitting: Internal Medicine

## 2020-08-04 DIAGNOSIS — M6281 Muscle weakness (generalized): Secondary | ICD-10-CM | POA: Diagnosis not present

## 2020-08-04 DIAGNOSIS — M25612 Stiffness of left shoulder, not elsewhere classified: Secondary | ICD-10-CM | POA: Diagnosis not present

## 2020-08-04 DIAGNOSIS — M25512 Pain in left shoulder: Secondary | ICD-10-CM | POA: Diagnosis not present

## 2020-08-05 DIAGNOSIS — M25512 Pain in left shoulder: Secondary | ICD-10-CM | POA: Diagnosis not present

## 2020-08-05 DIAGNOSIS — M6281 Muscle weakness (generalized): Secondary | ICD-10-CM | POA: Diagnosis not present

## 2020-08-05 DIAGNOSIS — M25612 Stiffness of left shoulder, not elsewhere classified: Secondary | ICD-10-CM | POA: Diagnosis not present

## 2020-08-07 DIAGNOSIS — Z20822 Contact with and (suspected) exposure to covid-19: Secondary | ICD-10-CM | POA: Diagnosis not present

## 2020-08-08 DIAGNOSIS — M25512 Pain in left shoulder: Secondary | ICD-10-CM | POA: Diagnosis not present

## 2020-08-08 DIAGNOSIS — M6281 Muscle weakness (generalized): Secondary | ICD-10-CM | POA: Diagnosis not present

## 2020-08-08 DIAGNOSIS — M25612 Stiffness of left shoulder, not elsewhere classified: Secondary | ICD-10-CM | POA: Diagnosis not present

## 2020-08-10 DIAGNOSIS — M25612 Stiffness of left shoulder, not elsewhere classified: Secondary | ICD-10-CM | POA: Diagnosis not present

## 2020-08-10 DIAGNOSIS — Z96652 Presence of left artificial knee joint: Secondary | ICD-10-CM | POA: Diagnosis not present

## 2020-08-10 DIAGNOSIS — M25512 Pain in left shoulder: Secondary | ICD-10-CM | POA: Diagnosis not present

## 2020-08-10 DIAGNOSIS — M6281 Muscle weakness (generalized): Secondary | ICD-10-CM | POA: Diagnosis not present

## 2020-08-12 DIAGNOSIS — M6281 Muscle weakness (generalized): Secondary | ICD-10-CM | POA: Diagnosis not present

## 2020-08-12 DIAGNOSIS — M25512 Pain in left shoulder: Secondary | ICD-10-CM | POA: Diagnosis not present

## 2020-08-12 DIAGNOSIS — M25612 Stiffness of left shoulder, not elsewhere classified: Secondary | ICD-10-CM | POA: Diagnosis not present

## 2020-08-15 DIAGNOSIS — M25612 Stiffness of left shoulder, not elsewhere classified: Secondary | ICD-10-CM | POA: Diagnosis not present

## 2020-08-15 DIAGNOSIS — M25512 Pain in left shoulder: Secondary | ICD-10-CM | POA: Diagnosis not present

## 2020-08-15 DIAGNOSIS — M6281 Muscle weakness (generalized): Secondary | ICD-10-CM | POA: Diagnosis not present

## 2020-08-17 DIAGNOSIS — M6281 Muscle weakness (generalized): Secondary | ICD-10-CM | POA: Diagnosis not present

## 2020-08-17 DIAGNOSIS — M25512 Pain in left shoulder: Secondary | ICD-10-CM | POA: Diagnosis not present

## 2020-08-17 DIAGNOSIS — M25612 Stiffness of left shoulder, not elsewhere classified: Secondary | ICD-10-CM | POA: Diagnosis not present

## 2020-08-19 DIAGNOSIS — M25612 Stiffness of left shoulder, not elsewhere classified: Secondary | ICD-10-CM | POA: Diagnosis not present

## 2020-08-19 DIAGNOSIS — M6281 Muscle weakness (generalized): Secondary | ICD-10-CM | POA: Diagnosis not present

## 2020-08-19 DIAGNOSIS — M25512 Pain in left shoulder: Secondary | ICD-10-CM | POA: Diagnosis not present

## 2020-08-22 DIAGNOSIS — M6281 Muscle weakness (generalized): Secondary | ICD-10-CM | POA: Diagnosis not present

## 2020-08-22 DIAGNOSIS — M25512 Pain in left shoulder: Secondary | ICD-10-CM | POA: Diagnosis not present

## 2020-08-22 DIAGNOSIS — M25612 Stiffness of left shoulder, not elsewhere classified: Secondary | ICD-10-CM | POA: Diagnosis not present

## 2020-08-24 DIAGNOSIS — M25612 Stiffness of left shoulder, not elsewhere classified: Secondary | ICD-10-CM | POA: Diagnosis not present

## 2020-08-24 DIAGNOSIS — M6281 Muscle weakness (generalized): Secondary | ICD-10-CM | POA: Diagnosis not present

## 2020-08-24 DIAGNOSIS — M25512 Pain in left shoulder: Secondary | ICD-10-CM | POA: Diagnosis not present

## 2020-08-26 DIAGNOSIS — M6281 Muscle weakness (generalized): Secondary | ICD-10-CM | POA: Diagnosis not present

## 2020-08-26 DIAGNOSIS — M25512 Pain in left shoulder: Secondary | ICD-10-CM | POA: Diagnosis not present

## 2020-08-26 DIAGNOSIS — M25612 Stiffness of left shoulder, not elsewhere classified: Secondary | ICD-10-CM | POA: Diagnosis not present

## 2020-09-24 DIAGNOSIS — R519 Headache, unspecified: Secondary | ICD-10-CM | POA: Diagnosis not present

## 2020-09-24 DIAGNOSIS — R059 Cough, unspecified: Secondary | ICD-10-CM | POA: Diagnosis not present

## 2020-09-24 DIAGNOSIS — Z20828 Contact with and (suspected) exposure to other viral communicable diseases: Secondary | ICD-10-CM | POA: Diagnosis not present

## 2020-09-28 ENCOUNTER — Ambulatory Visit: Payer: Medicare Other | Admitting: Gastroenterology

## 2020-10-04 DIAGNOSIS — S46012A Strain of muscle(s) and tendon(s) of the rotator cuff of left shoulder, initial encounter: Secondary | ICD-10-CM | POA: Diagnosis not present

## 2020-10-04 DIAGNOSIS — M948X1 Other specified disorders of cartilage, shoulder: Secondary | ICD-10-CM | POA: Diagnosis not present

## 2020-10-04 DIAGNOSIS — M25412 Effusion, left shoulder: Secondary | ICD-10-CM | POA: Diagnosis not present

## 2020-10-04 DIAGNOSIS — M7552 Bursitis of left shoulder: Secondary | ICD-10-CM | POA: Diagnosis not present

## 2020-10-13 DIAGNOSIS — H5319 Other subjective visual disturbances: Secondary | ICD-10-CM | POA: Diagnosis not present

## 2020-10-20 DIAGNOSIS — H26491 Other secondary cataract, right eye: Secondary | ICD-10-CM | POA: Diagnosis not present

## 2020-10-20 DIAGNOSIS — H43391 Other vitreous opacities, right eye: Secondary | ICD-10-CM | POA: Diagnosis not present

## 2020-11-07 DIAGNOSIS — M25512 Pain in left shoulder: Secondary | ICD-10-CM | POA: Diagnosis not present

## 2020-11-09 DIAGNOSIS — H43811 Vitreous degeneration, right eye: Secondary | ICD-10-CM | POA: Diagnosis not present

## 2020-11-09 DIAGNOSIS — H401131 Primary open-angle glaucoma, bilateral, mild stage: Secondary | ICD-10-CM | POA: Diagnosis not present

## 2020-11-09 DIAGNOSIS — H5319 Other subjective visual disturbances: Secondary | ICD-10-CM | POA: Diagnosis not present

## 2020-11-09 DIAGNOSIS — H43393 Other vitreous opacities, bilateral: Secondary | ICD-10-CM | POA: Diagnosis not present

## 2020-12-12 DIAGNOSIS — H43391 Other vitreous opacities, right eye: Secondary | ICD-10-CM | POA: Diagnosis not present

## 2020-12-12 DIAGNOSIS — H401131 Primary open-angle glaucoma, bilateral, mild stage: Secondary | ICD-10-CM | POA: Diagnosis not present

## 2020-12-12 DIAGNOSIS — H43392 Other vitreous opacities, left eye: Secondary | ICD-10-CM | POA: Diagnosis not present

## 2020-12-12 DIAGNOSIS — H43813 Vitreous degeneration, bilateral: Secondary | ICD-10-CM | POA: Diagnosis not present

## 2020-12-15 DIAGNOSIS — D225 Melanocytic nevi of trunk: Secondary | ICD-10-CM | POA: Diagnosis not present

## 2020-12-15 DIAGNOSIS — L578 Other skin changes due to chronic exposure to nonionizing radiation: Secondary | ICD-10-CM | POA: Diagnosis not present

## 2020-12-15 DIAGNOSIS — Z1283 Encounter for screening for malignant neoplasm of skin: Secondary | ICD-10-CM | POA: Diagnosis not present

## 2020-12-15 DIAGNOSIS — L298 Other pruritus: Secondary | ICD-10-CM | POA: Diagnosis not present

## 2020-12-15 DIAGNOSIS — L718 Other rosacea: Secondary | ICD-10-CM | POA: Diagnosis not present

## 2020-12-15 DIAGNOSIS — L820 Inflamed seborrheic keratosis: Secondary | ICD-10-CM | POA: Diagnosis not present

## 2021-01-11 ENCOUNTER — Ambulatory Visit: Payer: Medicare Other | Admitting: Gastroenterology

## 2021-01-23 DIAGNOSIS — E785 Hyperlipidemia, unspecified: Secondary | ICD-10-CM | POA: Diagnosis not present

## 2021-01-23 DIAGNOSIS — E7800 Pure hypercholesterolemia, unspecified: Secondary | ICD-10-CM | POA: Diagnosis not present

## 2021-01-23 DIAGNOSIS — R5383 Other fatigue: Secondary | ICD-10-CM | POA: Diagnosis not present

## 2021-01-23 DIAGNOSIS — E038 Other specified hypothyroidism: Secondary | ICD-10-CM | POA: Diagnosis not present

## 2021-01-23 DIAGNOSIS — E7801 Familial hypercholesterolemia: Secondary | ICD-10-CM | POA: Diagnosis not present

## 2021-01-31 DIAGNOSIS — J019 Acute sinusitis, unspecified: Secondary | ICD-10-CM | POA: Diagnosis not present

## 2021-01-31 DIAGNOSIS — R059 Cough, unspecified: Secondary | ICD-10-CM | POA: Diagnosis not present

## 2021-01-31 DIAGNOSIS — Z6823 Body mass index (BMI) 23.0-23.9, adult: Secondary | ICD-10-CM | POA: Diagnosis not present

## 2021-02-02 DIAGNOSIS — M79643 Pain in unspecified hand: Secondary | ICD-10-CM | POA: Diagnosis not present

## 2021-02-02 DIAGNOSIS — Z202 Contact with and (suspected) exposure to infections with a predominantly sexual mode of transmission: Secondary | ICD-10-CM | POA: Diagnosis not present

## 2021-02-02 DIAGNOSIS — G4709 Other insomnia: Secondary | ICD-10-CM | POA: Diagnosis not present

## 2021-02-02 DIAGNOSIS — F418 Other specified anxiety disorders: Secondary | ICD-10-CM | POA: Diagnosis not present

## 2021-02-02 DIAGNOSIS — R011 Cardiac murmur, unspecified: Secondary | ICD-10-CM | POA: Diagnosis not present

## 2021-02-02 DIAGNOSIS — E039 Hypothyroidism, unspecified: Secondary | ICD-10-CM | POA: Diagnosis not present

## 2021-02-02 DIAGNOSIS — I350 Nonrheumatic aortic (valve) stenosis: Secondary | ICD-10-CM | POA: Diagnosis not present

## 2021-02-02 DIAGNOSIS — G4700 Insomnia, unspecified: Secondary | ICD-10-CM | POA: Diagnosis not present

## 2021-02-02 DIAGNOSIS — Q210 Ventricular septal defect: Secondary | ICD-10-CM | POA: Diagnosis not present

## 2021-02-02 DIAGNOSIS — M810 Age-related osteoporosis without current pathological fracture: Secondary | ICD-10-CM | POA: Diagnosis not present

## 2021-02-02 DIAGNOSIS — E785 Hyperlipidemia, unspecified: Secondary | ICD-10-CM | POA: Diagnosis not present

## 2021-02-02 DIAGNOSIS — Z0000 Encounter for general adult medical examination without abnormal findings: Secondary | ICD-10-CM | POA: Diagnosis not present

## 2021-02-02 DIAGNOSIS — F3289 Other specified depressive episodes: Secondary | ICD-10-CM | POA: Diagnosis not present

## 2021-02-02 DIAGNOSIS — M545 Low back pain, unspecified: Secondary | ICD-10-CM | POA: Diagnosis not present

## 2021-02-20 DIAGNOSIS — H43393 Other vitreous opacities, bilateral: Secondary | ICD-10-CM | POA: Diagnosis not present

## 2021-02-20 DIAGNOSIS — H43813 Vitreous degeneration, bilateral: Secondary | ICD-10-CM | POA: Diagnosis not present

## 2021-02-20 DIAGNOSIS — H401131 Primary open-angle glaucoma, bilateral, mild stage: Secondary | ICD-10-CM | POA: Diagnosis not present

## 2021-03-03 DIAGNOSIS — Z20822 Contact with and (suspected) exposure to covid-19: Secondary | ICD-10-CM | POA: Diagnosis not present

## 2021-03-15 NOTE — Progress Notes (Signed)
Cardiology Office Note  Date: 03/16/2021   ID: TAYLOR SPILDE, DOB [birth year], MRN [id number]  PCP:  Rosalee Kaufman, PA-C  Cardiologist:  Rozann Lesches, MD Electrophysiologist:  None   Chief Complaint  Patient presents with   Cardiac follow-up    History of Present Illness: [name] is a 71 y.o. female last seen in January 2022.  She is here for a routine visit.  Reports no change in stamina, no increasing shortness of breath or exertional chest pain.  She has been using a stationary bicycle for about 30 minutes, in the warmer months enjoys walking or swimming at the [facility].  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  I reviewed her medications which are noted below.  I personally reviewed her ECG today which shows normal sinus rhythm.  Past Medical History:  Diagnosis Date   Bunion    Chronic constipation    Diverticulosis    History of kidney stones    Hypothyroidism    Left facial numbness    Mitral regurgitation    Moderate   Neuroma of foot    OA (osteoarthritis)    Left knee, hands   Seasonal allergies    Secondary pulmonary hypertension    PASP 40 mmHg 2011   Tinnitus    Ventricular septal defect (VSD), membranous     Past Surgical History:  Procedure Laterality Date   ABDOMINAL HYSTERECTOMY     BREAST EXCISIONAL BIOPSY Right 1996   BREAST LUMPECTOMY Right    benign   COLONOSCOPY   02/03/2003   RMR: Normal rectum and colon   COLONOSCOPY N/A 03/10/2013   Dr. Gala Romney-  minimal internal hemorrhoids/ anal papilla o/w normal appearing rectum. colonic diverticulosis   KNEE ARTHROPLASTY Left 08/08/2017   Procedure: LEFT TOTAL KNEE ARTHROPLASTY WITH COMPUTER NAVIGATION;  Surgeon: Rod Can, MD;  Location: [facility];  Service: Orthopedics;  Laterality: Left;  NEEDS RNFA   KNEE ARTHROSCOPY Left  11/30/2016   SHOULDER ARTHROSCOPY Left    TONSILLECTOMY      Current Outpatient Medications  Medication Sig Dispense Refill   ALPRAZolam (XANAX) 0.25 MG tablet Take 0.25 mg by mouth at bedtime as needed. May take one tablet once every month or two     Ascorbic Acid (VITAMIN C) 1000 MG tablet Take 1,000 mg by mouth daily as needed (Flu season).      diclofenac sodium (VOLTAREN) 1 % GEL Take 1 application by mouth 2 (two) times daily as needed.     diphenhydrAMINE (BENADRYL) 25 MG tablet Take 25 mg by mouth 2 (two) times daily as needed. Patient alternates with Xyzal     ELDERBERRY PO Take 2 tablets by mouth daily.     EPINEPHrine 0.3 mg/0.3 mL IJ SOAJ injection Inject 0.3 mg into the muscle once. Reported on 01/25/2015     estrogens, conjugated, (PREMARIN) 0.625 MG tablet Take 0.5 tablets by mouth daily.     fish oil-omega-3 fatty acids 1000 MG capsule Take 2 g by mouth daily.      fluticasone (FLONASE) 50 MCG/ACT nasal spray Place 2 sprays into both nostrils 2 (two) times daily as needed for allergies.     levocetirizine (XYZAL) 5 MG tablet TAKE 1 TABLET (5 MG TOTAL) BY MOUTH DAILY. 30 tablet 1   levothyroxine (SYNTHROID) 100 MCG tablet Take 100 mcg by mouth every other day.  levothyroxine (SYNTHROID, LEVOTHROID) 88 MCG tablet Take 88 mcg by mouth every other day. ALTERNATING WITH 100MCG     Melatonin 10 MG TABS Take 1 tablet by mouth at bedtime as needed.      montelukast (SINGULAIR) 10 MG tablet TAKE 1 TABLET (10 MG TOTAL) BY MOUTH AT BEDTIME. 30 tablet 0   Multiple Vitamin (MULTIVITAMIN) capsule Take 1 capsule by mouth daily.     polyethylene glycol (MIRALAX / GLYCOLAX) 17 g packet Take 17 g by mouth daily.     Potassium 95 MG TABS Take 1 tablet by mouth every other day.      Probiotic Product (PROBIOTIC DAILY PO) Take 1 capsule by mouth daily.      TURMERIC PO Take 1 capsule by mouth daily.     vitamin E 400 UNIT capsule Take 400 Units by mouth daily.     zinc gluconate 50 MG tablet  Take 50 mg by mouth daily.     zolpidem (AMBIEN) 10 MG tablet Take 5 mg by mouth at bedtime as needed for sleep.     desonide (DESOWEN) 0.05 % cream Apply 1 application topically as needed. ONCE DAILY IF NEEDED  3   latanoprost (XALATAN) 0.005 % ophthalmic solution Place 1 drop into both eyes daily.     Olopatadine HCl 0.7 % SOLN Place 1 drop into both eyes 2 (two) times daily as needed (eye allergies).     No current facility-administered medications for this visit.   Allergies:  Hydrocodone and Sulfa antibiotics   ROS:  No syncope.  Physical Exam: VS:  BP 130/90    Pulse 64    Ht 5\' 2"  (1.575 m)    Wt 128 lb 6.4 oz (58.2 kg)    SpO2 98%    BMI 23.48 kg/m , BMI Body mass index is 23.48 kg/m.  Wt Readings from Last 3 Encounters:  03/16/21 128 lb 6.4 oz (58.2 kg)  02/10/20 128 lb (58.1 kg)  01/28/19 129 lb (58.5 kg)    General: Patient appears comfortable at rest. HEENT: Conjunctiva and lids normal, wearing a mask. Neck: Supple, no elevated JVP or carotid bruits, no thyromegaly. Lungs: Clear to auscultation, nonlabored breathing at rest. Cardiac: Regular rate and rhythm, no S3, 3/6 harsh systolic murmur. Extremities: No pitting edema.  ECG:  An ECG dated 02/10/2020 was personally reviewed today and demonstrated:  Sinus rhythm with PVCs, increased voltage.  Recent Labwork:  No interval lab work for review today.  Other Studies Reviewed Today:  Echocardiogram 03/16/2020:  1. Small perimembranous VSD with mild left to right shunting. . Left  ventricular ejection fraction, by estimation, is 60 to 65%. The left  ventricle has normal function. The left ventricle has no regional wall  motion abnormalities. Left ventricular  diastolic parameters are indeterminate. The average left ventricular  global longitudinal strain is -17.0 %. The global longitudinal strain is  normal.   2. Right ventricular systolic function is normal. The right ventricular  size is normal.   3. Left atrial  size was severely dilated.   4. Right atrial size was mildly dilated.   5. The mitral valve is normal in structure. Mild mitral valve  regurgitation. No evidence of mitral stenosis.   6. The aortic valve was not well visualized. Aortic valve regurgitation  is not visualized. No aortic stenosis is present.   7. Dilated pulmonary artery pulmonary artery.   8. The inferior vena cava is normal in size with greater than 50%  respiratory variability,  suggesting right atrial pressure of 3 mmHg.   Assessment and Plan:  1.  Perimembranous VSD, stable by echocardiogram from last year with left-to-right shunting.  RV contraction normal.  No significant change in murmur on examination today.  Continue with observation.  ECG is also normal.  2.  Mitral regurgitation, mild by her last echocardiogram in February 2022.  3.  Elevated blood pressure without standing hypertension.  She states that typically measurements are much better, would continue to observe for now.  Medication Adjustments/Labs and Tests Ordered: Current medicines are reviewed at length with the patient today.  Concerns regarding medicines are outlined above.   Tests Ordered: Orders Placed This Encounter  Procedures   EKG 12-Lead    Medication Changes: No orders of the defined types were placed in this encounter.   Disposition:  Follow up  1 year.  Signed, Satira Sark, MD, [facility] 03/16/2021 1:19 PM    [facility] [place], [place], [place] [address] Phone: [phone]; Fax: [phone]

## 2021-03-16 ENCOUNTER — Ambulatory Visit (INDEPENDENT_AMBULATORY_CARE_PROVIDER_SITE_OTHER): Payer: Medicare Other | Admitting: Cardiology

## 2021-03-16 ENCOUNTER — Encounter: Payer: Self-pay | Admitting: Cardiology

## 2021-03-16 VITALS — BP 130/90 | HR 64 | Ht 62.0 in | Wt 128.4 lb

## 2021-03-16 DIAGNOSIS — Q210 Ventricular septal defect: Secondary | ICD-10-CM

## 2021-03-16 DIAGNOSIS — I340 Nonrheumatic mitral (valve) insufficiency: Secondary | ICD-10-CM | POA: Diagnosis not present

## 2021-03-16 NOTE — Patient Instructions (Addendum)

## 2021-03-20 ENCOUNTER — Other Ambulatory Visit: Payer: Self-pay

## 2021-03-20 ENCOUNTER — Encounter (INDEPENDENT_AMBULATORY_CARE_PROVIDER_SITE_OTHER): Payer: Medicare Other | Admitting: Ophthalmology

## 2021-03-20 DIAGNOSIS — H26491 Other secondary cataract, right eye: Secondary | ICD-10-CM | POA: Diagnosis not present

## 2021-03-20 DIAGNOSIS — H43813 Vitreous degeneration, bilateral: Secondary | ICD-10-CM | POA: Diagnosis not present

## 2021-04-01 DIAGNOSIS — Z20828 Contact with and (suspected) exposure to other viral communicable diseases: Secondary | ICD-10-CM | POA: Diagnosis not present

## 2021-04-20 DIAGNOSIS — I10 Essential (primary) hypertension: Secondary | ICD-10-CM | POA: Diagnosis not present

## 2021-04-20 DIAGNOSIS — R58 Hemorrhage, not elsewhere classified: Secondary | ICD-10-CM | POA: Diagnosis not present

## 2021-04-20 DIAGNOSIS — Z6823 Body mass index (BMI) 23.0-23.9, adult: Secondary | ICD-10-CM | POA: Diagnosis not present

## 2021-04-20 DIAGNOSIS — E538 Deficiency of other specified B group vitamins: Secondary | ICD-10-CM | POA: Diagnosis not present

## 2021-04-20 DIAGNOSIS — J019 Acute sinusitis, unspecified: Secondary | ICD-10-CM | POA: Diagnosis not present

## 2021-04-21 DIAGNOSIS — Z20822 Contact with and (suspected) exposure to covid-19: Secondary | ICD-10-CM | POA: Diagnosis not present

## 2021-04-26 ENCOUNTER — Other Ambulatory Visit: Payer: Self-pay | Admitting: Physician Assistant

## 2021-04-26 DIAGNOSIS — Z1231 Encounter for screening mammogram for malignant neoplasm of breast: Secondary | ICD-10-CM

## 2021-05-13 DIAGNOSIS — Z20822 Contact with and (suspected) exposure to covid-19: Secondary | ICD-10-CM | POA: Diagnosis not present

## 2021-05-15 ENCOUNTER — Encounter: Payer: Self-pay | Admitting: *Deleted

## 2021-05-15 ENCOUNTER — Encounter: Payer: Self-pay | Admitting: Gastroenterology

## 2021-05-15 ENCOUNTER — Telehealth: Payer: Self-pay | Admitting: *Deleted

## 2021-05-15 ENCOUNTER — Ambulatory Visit (INDEPENDENT_AMBULATORY_CARE_PROVIDER_SITE_OTHER): Payer: Medicare Other | Admitting: Gastroenterology

## 2021-05-15 VITALS — BP 122/70 | HR 60 | Temp 97.1°F | Ht 62.0 in | Wt 130.6 lb

## 2021-05-15 DIAGNOSIS — R198 Other specified symptoms and signs involving the digestive system and abdomen: Secondary | ICD-10-CM | POA: Insufficient documentation

## 2021-05-15 DIAGNOSIS — R1030 Lower abdominal pain, unspecified: Secondary | ICD-10-CM | POA: Diagnosis not present

## 2021-05-15 NOTE — Progress Notes (Signed)
? ? ? ?GI Office Note   ? ?Referring Provider: Rosalee Kaufman, * ?Primary Care Physician:  Rosalee Kaufman, PA-C  ?Primary Gastroenterologist: ? ?Chief Complaint  ? ?Chief Complaint  ?Patient presents with  ? Abdominal Pain  ?  Abdominal pain feels like menstrual cramps and has been going on the last three months. Pt is still having issues with bloating and explosive diarrhea at times.   ? ? ?History of Present Illness  ? ?[name] is a 71 y.o. female presenting today for further evaluation of abdominal pain.  Patient was last seen in 2015.  She has a history of chronic constipation associated with abdominal pain, bloating.  She had a colonoscopy in 2015, terminal ileum and colon appeared normal except for diverticulosis, hemorrhoids.  10-year screening exams recommended. ? ?Waking up at night with gurgling, rolling, noisy abdomen. Embarrassing during the day. Symptoms worse over the past two years. Not associated with BMs. Having symptoms every night. Four years ago started having explosive diarrhea, no warning, pretty consistent. Usually just one episode when it occurs and resolves, but happening 3-4 times per month and associated with incontinence. Saw PCP in 01/2021, restarted probiotics, BMs are somewhat improved. No further having urgency, can control stools. Chronically has constipation and has been on Miralax for years. Usually has soft postprandial stool 3 times per day on Miralax one capful daily. BMs usually about 30 minutes after eating. September was on antibiotics. On antibiotics in January. And again on antibiotics 3 weeks ago. Denies persistent diarrhea and if anything stools are better. No hard stools. No melena, brbpr. No heartburn. No vomiting. No unintentional weight loss. Cramping in lower abdominal once per month, like a period. Going on for 3 months. Lasts for couple of days at a time. Not associated with urinary symptoms or BMs. ? ?Medications  ? ?Current Outpatient  Medications  ?Medication Sig Dispense Refill  ? ALPRAZolam (XANAX) 0.25 MG tablet Take 0.25 mg by mouth at bedtime as needed. May take one tablet once every month or two    ? Ascorbic Acid (VITAMIN C) 1000 MG tablet Take 1,000 mg by mouth daily as needed (Flu season).     ? calcium citrate (CALCITRATE - DOSED IN MG ELEMENTAL CALCIUM) 950 (200 Ca) MG tablet Take 200 mg of elemental calcium by mouth daily.    ? diclofenac sodium (VOLTAREN) 1 % GEL Take 1 application by mouth 2 (two) times daily as needed.    ? diphenhydrAMINE (BENADRYL) 25 MG tablet Take 25 mg by mouth 2 (two) times daily as needed. Patient alternates with Xyzal    ? ELDERBERRY PO Take 2 tablets by mouth daily.    ? estrogens, conjugated, (PREMARIN) 0.625 MG tablet Take 0.5 tablets by mouth daily.    ? fish oil-omega-3 fatty acids 1000 MG capsule Take 2 g by mouth daily.     ? fluticasone (FLONASE) 50 MCG/ACT nasal spray Place 2 sprays into both nostrils 2 (two) times daily as needed for allergies.    ? levocetirizine (XYZAL) 5 MG tablet TAKE 1 TABLET (5 MG TOTAL) BY MOUTH DAILY. 30 tablet 1  ? levothyroxine (SYNTHROID) 100 MCG tablet Take 100 mcg by mouth every other day.    ? levothyroxine (SYNTHROID, LEVOTHROID) 88 MCG tablet Take 88 mcg by mouth every other day. ALTERNATING WITH 100MCG    ? Melatonin 10 MG TABS Take 1 tablet by mouth at bedtime as needed.     ? montelukast (SINGULAIR) 10 MG tablet  TAKE 1 TABLET (10 MG TOTAL) BY MOUTH AT BEDTIME. 30 tablet 0  ? Multiple Vitamin (MULTIVITAMIN) capsule Take 1 capsule by mouth daily.    ? polyethylene glycol (MIRALAX / GLYCOLAX) 17 g packet Take 17 g by mouth daily.    ? Potassium 95 MG TABS Take 1 tablet by mouth every other day.     ? Probiotic Product (PROBIOTIC DAILY PO) Take 1 capsule by mouth daily.     ? TURMERIC PO Take 1 capsule by mouth daily.    ? vitamin E 400 UNIT capsule Take 400 Units by mouth daily.    ? zinc gluconate 50 MG tablet Take 50 mg by mouth daily.    ? zolpidem (AMBIEN) 10  MG tablet Take 5 mg by mouth at bedtime as needed for sleep.    ? EPINEPHrine 0.3 mg/0.3 mL IJ SOAJ injection Inject 0.3 mg into the muscle once. Reported on 01/25/2015 (Patient not taking: Reported on 05/15/2021)    ? ?No current facility-administered medications for this visit.  ? ? ?Allergies  ? ?Allergies as of 05/15/2021 - Review Complete 05/15/2021  ?Allergen Reaction Noted  ? Hydrocodone Itching 01/06/2019  ? Sulfa antibiotics Rash 12/13/2011  ? ?  ?Past Medical History  ? ?Past Medical History:  ?Diagnosis Date  ? Bunion   ? Chronic constipation   ? Diverticulosis   ? History of kidney stones   ? Hypothyroidism   ? Left facial numbness   ? Mitral regurgitation   ? Moderate  ? Neuroma of foot   ? OA (osteoarthritis)   ? Left knee, hands  ? Seasonal allergies   ? Secondary pulmonary hypertension   ? PASP 40 mmHg 2011  ? Tinnitus   ? Ventricular septal defect (VSD), membranous   ? ? ?Past Surgical History  ? ?Past Surgical History:  ?Procedure Laterality Date  ? ABDOMINAL HYSTERECTOMY    ? BREAST EXCISIONAL BIOPSY Right 1996  ? BREAST LUMPECTOMY Right   ? benign  ? COLONOSCOPY   02/03/2003  ? RMR: Normal rectum and colon  ? COLONOSCOPY N/A 03/10/2013  ? Dr. Gala Romney-  minimal internal hemorrhoids/ anal papilla o/w normal appearing rectum. colonic diverticulosis  ? KNEE ARTHROPLASTY Left 08/08/2017  ? Procedure: LEFT TOTAL KNEE ARTHROPLASTY WITH COMPUTER NAVIGATION;  Surgeon: Rod Can, MD;  Location: [facility];  Service: Orthopedics;  Laterality: Left;  NEEDS RNFA  ? KNEE ARTHROSCOPY Left 11/30/2016  ? SHOULDER ARTHROSCOPY Left   ? TONSILLECTOMY    ? ? ?Past Family History  ? ?Family History  ?Problem Relation Age of Onset  ? Hypertension Father   ? Heart attack Father   ? Hypertension Sister   ? Parkinson's disease Mother   ? Colon cancer Neg Hx   ? ? ?Past Social History  ? ?Social History  ? ?Socioeconomic History  ? Marital status: Divorced  ?  Spouse name: Not on file  ? Number of children: 3  ? Years of  education: some college  ? Highest education level: Not on file  ?Occupational History  ? Occupation: Retired  ?  Employer: [employer]  ?Tobacco Use  ? Smoking status: Former  ?  Packs/day: 0.50  ?  Years: 5.00  ?  Pack years: 2.50  ?  Types: Cigarettes  ?  Start date: 05/18/1963  ?  Quit date: 10/23/1968  ?  Years since quitting: 52.5  ? Smokeless tobacco: Never  ? Tobacco comments:  ?  smoked in her 86s  ?Vaping Use  ?  Vaping Use: Never used  ?Substance and Sexual Activity  ? Alcohol use: Not Currently  ? Drug use: No  ? Sexual activity: Yes  ?Other Topics Concern  ? Not on file  ?Social History Narrative  ? Lives at home alone.  ? Right-handed.  ? Caffeine use:  2 cups per day.  ? ?Social Determinants of Health  ? ?Financial Resource Strain: Not on file  ?Food Insecurity: Not on file  ?Transportation Needs: Not on file  ?Physical Activity: Not on file  ?Stress: Not on file  ?Social Connections: Not on file  ?Intimate Partner Violence: Not on file  ? ? ?Review of Systems  ? ?General: Negative for anorexia, weight loss, fever, chills, fatigue, weakness. ?ENT: Negative for hoarseness, difficulty swallowing , nasal congestion. ?CV: Negative for chest pain, angina, palpitations, dyspnea on exertion, peripheral edema.  ?Respiratory: Negative for dyspnea at rest, dyspnea on exertion, cough, sputum, wheezing.  ?GI: See history of present illness. ?GU:  Negative for dysuria, hematuria, urinary incontinence, urinary frequency, nocturnal urination.  ?Endo: Negative for unusual weight change.  ?   ?Physical Exam  ? ?BP 122/70 (BP Location: Right Arm, Patient Position: Sitting, Cuff Size: Normal)   Pulse 60   Temp (!) 97.1 ?F (36.2 ?C) (Temporal)   Ht '5\' 2"'$  (1.575 m)   Wt 130 lb 9.6 oz (59.2 kg)   SpO2 99%   BMI 23.89 kg/m?  ?  ?General: Well-nourished, well-developed in no acute distress.  ?Eyes: No icterus. ?Mouth: Oropharyngeal mucosa moist and pink , no lesions erythema or exudate. ?Lungs: Clear to auscultation  bilaterally.  ?Heart: Regular rate and rhythm, no murmurs rubs or gallops.  ?Abdomen: Bowel sounds are normal, nondistended, no hepatosplenomegaly or masses,  ?no abdominal bruits or hernia , no rebound or guarding. Mild

## 2021-05-15 NOTE — Telephone Encounter (Signed)
Members with a [facility] Medicare Supplement plan do not require pre-certification ?

## 2021-05-15 NOTE — Patient Instructions (Signed)
Please have labs done at [facility], [address], [place]. ?CT scan of your abdomen to be scheduled.  ?We will be in touch with results as available. ?Continue probiotic daily. ?Continue miralax daily.  ?Below are some things that can contribute to bloating, you may find helpful. ? ?Abdominal Bloating ?When you have abdominal bloating, your abdomen may feel full, tight, or painful. It may also look bigger than normal or swollen (distended). Common causes of abdominal bloating include: ?Swallowing air. ?Constipation. ?Problems digesting food. ?Eating too much. ?Irritable bowel syndrome. This is a condition that affects the large intestine. ?Lactose intolerance. This is an inability to digest lactose, a natural sugar in dairy products. ?Celiac disease. This is a condition that affects the ability to digest gluten, a protein found in some grains. ?Gastroparesis. This is a condition that slows down the movement of food in the stomach and small intestine. It is more common in people with diabetes mellitus. ?Gastroesophageal reflux disease (GERD). This is a condition that makes stomach acid flow back into the esophagus. ?Urinary retention. This means that the body is holding onto urine, and the bladder cannot be emptied all the way. ?Follow these instructions at home: ?Eating and drinking ?Avoid eating too much. ?Try not to swallow air while talking or eating. ?Avoid eating while lying down. ?Avoid these foods and drinks: ?Foods that cause gas, such as broccoli, cabbage, cauliflower, and baked beans. ?Carbonated drinks. ?Hard candy. ?Chewing gum. ?Medicines ?Take over-the-counter and prescription medicines only as told by your health care provider. ?Take probiotic medicines. These medicines contain live bacteria or yeasts that can help digestion. ?Take coated peppermint oil capsules. ?General instructions ?Try to exercise regularly. Exercise may help to relieve bloating that is caused by gas and relieve  constipation. ?Keep all follow-up visits. This is important. ?Contact a health care provider if: ?You have nausea and vomiting. ?You have diarrhea. ?You have abdominal pain. ?You have unusual weight loss or weight gain. ?You have severe pain, and medicines do not help. ?Get help right away if: ?You have chest pain. ?You have trouble breathing. ?You have shortness of breath. ?You have trouble urinating. ?You have darker urine than normal. ?You have blood in your stools or have dark, tarry stools. ?These symptoms may represent a serious problem that is an emergency. Do not wait to see if the symptoms will go away. Get medical help right away. Call your local emergency services (911 in the [place] Do not drive yourself to the hospital. ?Summary ?Abdominal bloating means that the abdomen is swollen. ?Common causes of abdominal bloating are swallowing air, constipation, and problems digesting food. ?Avoid eating too much and avoid swallowing air. ?Avoid foods that cause gas, carbonated drinks, hard candy, and chewing gum. ?This information is not intended to replace advice given to you by your health care provider. Make sure you discuss any questions you have with your health care provider. ?Document Revised: 08/18/2019 Document Reviewed: 08/18/2019 ?Elsevier Patient Education ? [place]. ? ?

## 2021-05-16 LAB — CBC WITH DIFFERENTIAL/PLATELET
Basophils Absolute: 0 10*3/uL (ref 0.0–0.2)
Basos: 0 %
EOS (ABSOLUTE): 0.1 10*3/uL (ref 0.0–0.4)
Eos: 1 %
Hematocrit: 42.1 % (ref 34.0–46.6)
Hemoglobin: 14.1 g/dL (ref 11.1–15.9)
Immature Grans (Abs): 0 10*3/uL (ref 0.0–0.1)
Immature Granulocytes: 1 %
Lymphocytes Absolute: 0.5 10*3/uL — ABNORMAL LOW (ref 0.7–3.1)
Lymphs: 12 %
MCH: 32.9 pg (ref 26.6–33.0)
MCHC: 33.5 g/dL (ref 31.5–35.7)
MCV: 98 fL — ABNORMAL HIGH (ref 79–97)
Monocytes Absolute: 0.4 10*3/uL (ref 0.1–0.9)
Monocytes: 10 %
Neutrophils Absolute: 3.3 10*3/uL (ref 1.4–7.0)
Neutrophils: 76 %
Platelets: 171 10*3/uL (ref 150–450)
RBC: 4.28 x10E6/uL (ref 3.77–5.28)
RDW: 11.8 % (ref 11.7–15.4)
WBC: 4.3 10*3/uL (ref 3.4–10.8)

## 2021-05-16 LAB — COMPREHENSIVE METABOLIC PANEL
ALT: 20 IU/L (ref 0–32)
AST: 25 IU/L (ref 0–40)
Albumin/Globulin Ratio: 2.3 — ABNORMAL HIGH (ref 1.2–2.2)
Albumin: 4.6 g/dL (ref 3.7–4.7)
Alkaline Phosphatase: 49 IU/L (ref 44–121)
BUN/Creatinine Ratio: 20 (ref 12–28)
BUN: 16 mg/dL (ref 8–27)
Bilirubin Total: 0.5 mg/dL (ref 0.0–1.2)
CO2: 24 mmol/L (ref 20–29)
Calcium: 9.8 mg/dL (ref 8.7–10.3)
Chloride: 103 mmol/L (ref 96–106)
Creatinine, Ser: 0.81 mg/dL (ref 0.57–1.00)
Globulin, Total: 2 g/dL (ref 1.5–4.5)
Glucose: 90 mg/dL (ref 70–99)
Potassium: 4.3 mmol/L (ref 3.5–5.2)
Sodium: 141 mmol/L (ref 134–144)
Total Protein: 6.6 g/dL (ref 6.0–8.5)
eGFR: 77 mL/min/{1.73_m2} (ref >59)

## 2021-05-16 LAB — LIPASE: Lipase: 63 U/L (ref 14–85)

## 2021-05-16 LAB — IGA: IgA/Immunoglobulin A, Serum: 273 mg/dL (ref 64–422)

## 2021-05-16 LAB — TISSUE TRANSGLUTAMINASE, IGA: Transglutaminase IgA: <2 U/mL (ref 0–3)

## 2021-05-23 DIAGNOSIS — Z20822 Contact with and (suspected) exposure to covid-19: Secondary | ICD-10-CM | POA: Diagnosis not present

## 2021-05-30 DIAGNOSIS — Z20822 Contact with and (suspected) exposure to covid-19: Secondary | ICD-10-CM | POA: Diagnosis not present

## 2021-06-12 ENCOUNTER — Ambulatory Visit
Admission: RE | Admit: 2021-06-12 | Discharge: 2021-06-12 | Disposition: A | Discharge status: Home / Self Care | Payer: Medicare Other | Source: Ambulatory Visit | Attending: Physician Assistant | Admitting: Physician Assistant

## 2021-06-12 DIAGNOSIS — Z1231 Encounter for screening mammogram for malignant neoplasm of breast: Secondary | ICD-10-CM | POA: Diagnosis not present

## 2021-06-15 DIAGNOSIS — I781 Nevus, non-neoplastic: Secondary | ICD-10-CM | POA: Diagnosis not present

## 2021-06-15 DIAGNOSIS — L718 Other rosacea: Secondary | ICD-10-CM | POA: Diagnosis not present

## 2021-06-15 DIAGNOSIS — S61411A Laceration without foreign body of right hand, initial encounter: Secondary | ICD-10-CM | POA: Diagnosis not present

## 2021-06-21 ENCOUNTER — Ambulatory Visit (HOSPITAL_COMMUNITY)
Admission: RE | Admit: 2021-06-21 | Discharge: 2021-06-21 | Disposition: A | Discharge status: Home / Self Care | Payer: Medicare Other | Source: Ambulatory Visit | Attending: Gastroenterology | Admitting: Gastroenterology

## 2021-06-21 DIAGNOSIS — R198 Other specified symptoms and signs involving the digestive system and abdomen: Secondary | ICD-10-CM | POA: Diagnosis not present

## 2021-06-21 DIAGNOSIS — R1030 Lower abdominal pain, unspecified: Secondary | ICD-10-CM

## 2021-06-21 DIAGNOSIS — R109 Unspecified abdominal pain: Secondary | ICD-10-CM | POA: Diagnosis not present

## 2021-06-21 MED ORDER — IOHEXOL 300 MG/ML  SOLN
80.0000 mL | Freq: Once | INTRAMUSCULAR | Status: AC | PRN
  Administered 2021-06-21: 80 mL via INTRAVENOUS

## 2021-06-28 ENCOUNTER — Telehealth: Payer: Self-pay

## 2021-06-28 NOTE — Telephone Encounter (Signed)
See result note.  

## 2021-06-28 NOTE — Telephone Encounter (Signed)
Pt called regarding recent CT. Pt would like to know the results.

## 2021-07-04 DIAGNOSIS — R197 Diarrhea, unspecified: Secondary | ICD-10-CM | POA: Diagnosis not present

## 2021-07-04 DIAGNOSIS — R109 Unspecified abdominal pain: Secondary | ICD-10-CM | POA: Diagnosis not present

## 2021-07-19 DIAGNOSIS — R030 Elevated blood-pressure reading, without diagnosis of hypertension: Secondary | ICD-10-CM | POA: Diagnosis not present

## 2021-07-19 DIAGNOSIS — Z6823 Body mass index (BMI) 23.0-23.9, adult: Secondary | ICD-10-CM | POA: Diagnosis not present

## 2021-07-19 DIAGNOSIS — M79645 Pain in left finger(s): Secondary | ICD-10-CM | POA: Diagnosis not present

## 2021-10-25 DIAGNOSIS — E039 Hypothyroidism, unspecified: Secondary | ICD-10-CM | POA: Diagnosis not present

## 2021-10-25 DIAGNOSIS — R5383 Other fatigue: Secondary | ICD-10-CM | POA: Diagnosis not present

## 2021-10-25 DIAGNOSIS — E7801 Familial hypercholesterolemia: Secondary | ICD-10-CM | POA: Diagnosis not present

## 2021-10-25 DIAGNOSIS — E785 Hyperlipidemia, unspecified: Secondary | ICD-10-CM | POA: Diagnosis not present

## 2021-11-07 DIAGNOSIS — Z1389 Encounter for screening for other disorder: Secondary | ICD-10-CM | POA: Diagnosis not present

## 2021-11-07 DIAGNOSIS — G4700 Insomnia, unspecified: Secondary | ICD-10-CM | POA: Diagnosis not present

## 2021-11-07 DIAGNOSIS — Z1331 Encounter for screening for depression: Secondary | ICD-10-CM | POA: Diagnosis not present

## 2021-11-07 DIAGNOSIS — M545 Low back pain, unspecified: Secondary | ICD-10-CM | POA: Diagnosis not present

## 2021-11-07 DIAGNOSIS — I350 Nonrheumatic aortic (valve) stenosis: Secondary | ICD-10-CM | POA: Diagnosis not present

## 2021-11-07 DIAGNOSIS — R011 Cardiac murmur, unspecified: Secondary | ICD-10-CM | POA: Diagnosis not present

## 2021-11-07 DIAGNOSIS — F419 Anxiety disorder, unspecified: Secondary | ICD-10-CM | POA: Diagnosis not present

## 2021-11-07 DIAGNOSIS — E039 Hypothyroidism, unspecified: Secondary | ICD-10-CM | POA: Diagnosis not present

## 2021-11-07 DIAGNOSIS — M79643 Pain in unspecified hand: Secondary | ICD-10-CM | POA: Diagnosis not present

## 2021-11-07 DIAGNOSIS — E785 Hyperlipidemia, unspecified: Secondary | ICD-10-CM | POA: Diagnosis not present

## 2021-11-07 DIAGNOSIS — Q210 Ventricular septal defect: Secondary | ICD-10-CM | POA: Diagnosis not present

## 2021-11-30 DIAGNOSIS — H401131 Primary open-angle glaucoma, bilateral, mild stage: Secondary | ICD-10-CM | POA: Diagnosis not present

## 2021-12-04 DIAGNOSIS — E039 Hypothyroidism, unspecified: Secondary | ICD-10-CM | POA: Diagnosis not present

## 2022-01-01 DIAGNOSIS — R030 Elevated blood-pressure reading, without diagnosis of hypertension: Secondary | ICD-10-CM | POA: Diagnosis not present

## 2022-01-01 DIAGNOSIS — J209 Acute bronchitis, unspecified: Secondary | ICD-10-CM | POA: Diagnosis not present

## 2022-01-01 DIAGNOSIS — R300 Dysuria: Secondary | ICD-10-CM | POA: Diagnosis not present

## 2022-01-01 DIAGNOSIS — J019 Acute sinusitis, unspecified: Secondary | ICD-10-CM | POA: Diagnosis not present

## 2022-01-01 DIAGNOSIS — Z6823 Body mass index (BMI) 23.0-23.9, adult: Secondary | ICD-10-CM | POA: Diagnosis not present

## 2022-01-03 IMAGING — MG DIGITAL SCREENING BILAT W/ TOMO W/ CAD
8 series · 8 of 24 positions shown · non-contrast
Comparison: Previous exam(s).

CLINICAL DATA: Screening.

EXAM:
DIGITAL SCREENING BILATERAL MAMMOGRAM WITH TOMO AND CAD

[R MLO synth-2D]
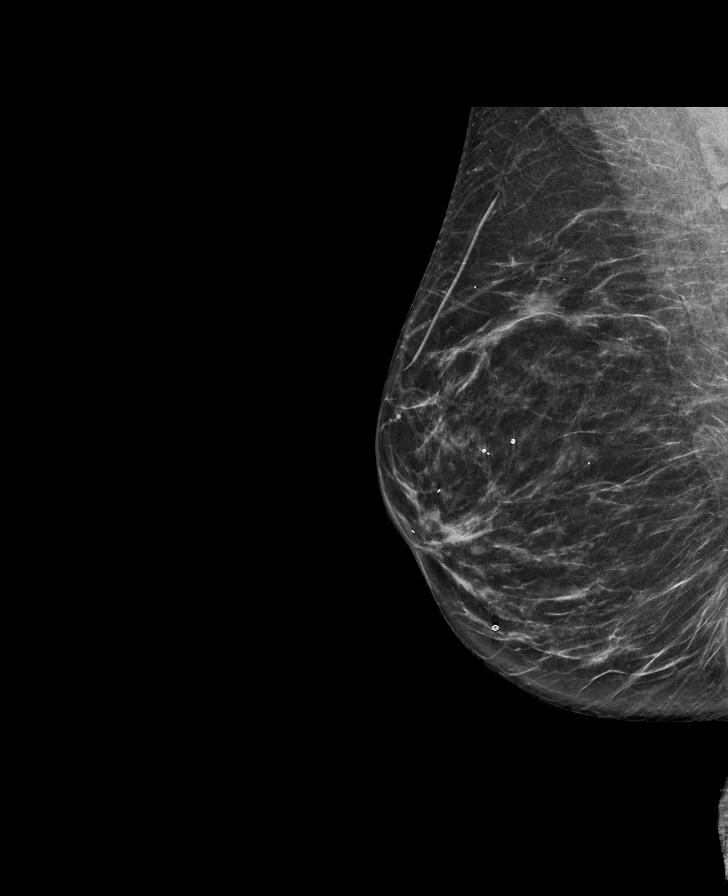

[L MLO synth-2D]
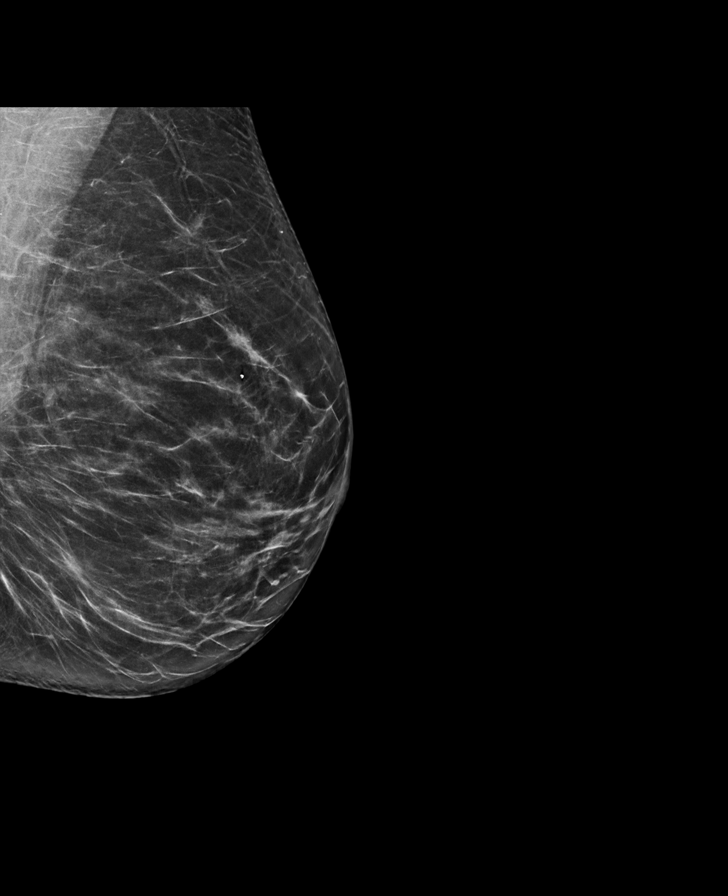

[L CC synth-2D]
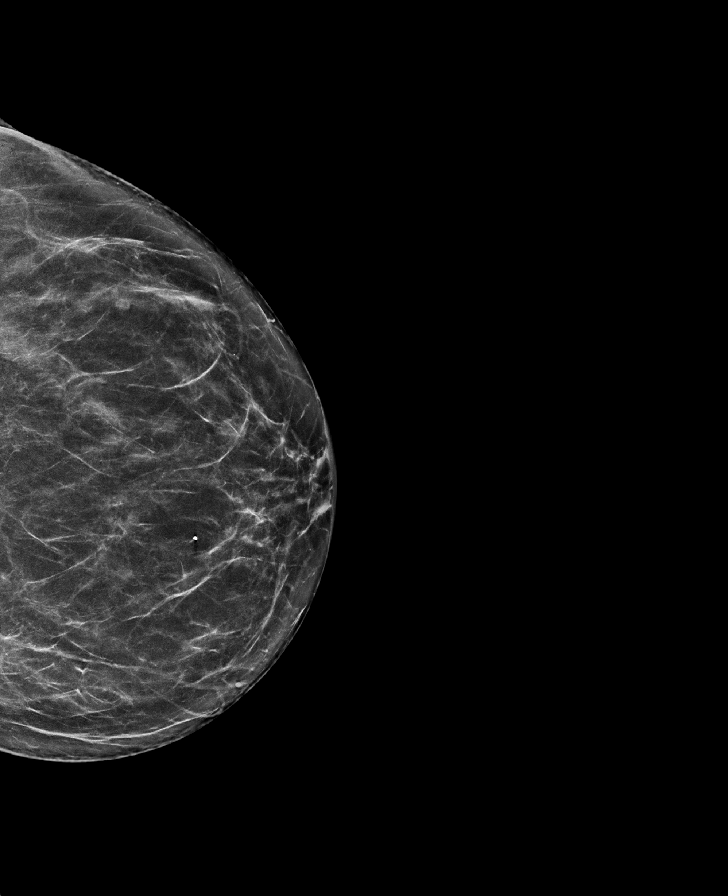

[R CC synth-2D]
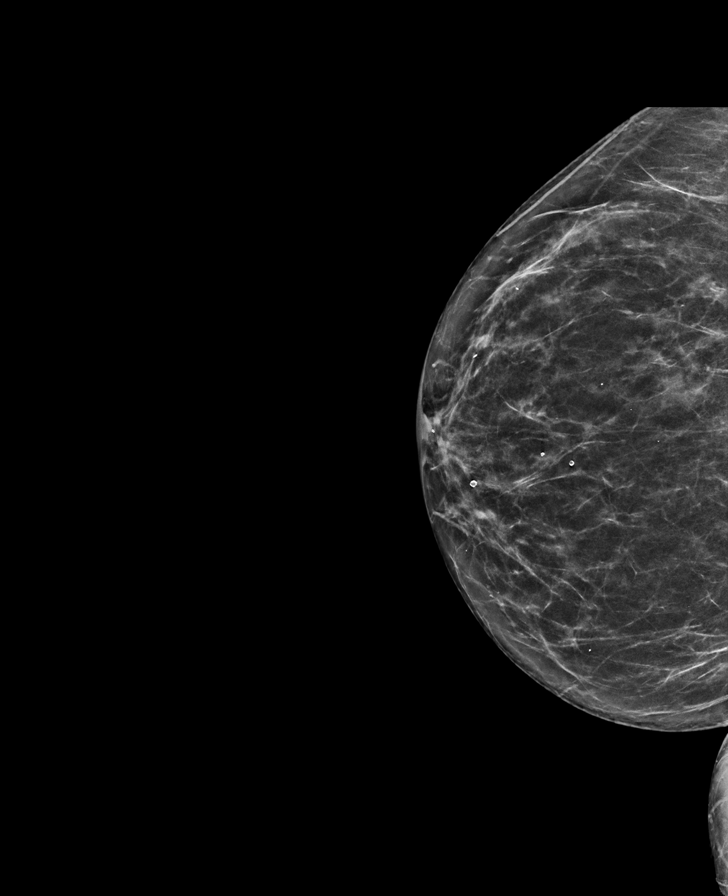

[L MLO tomo · tomo slice 39/78.0]
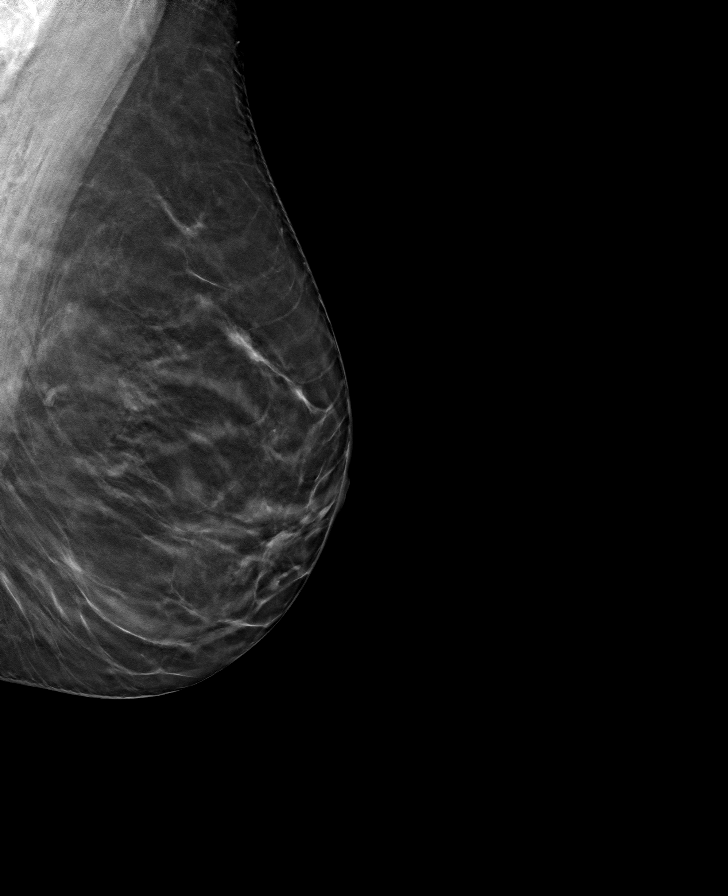

[R MLO tomo · tomo slice 36/71.0]
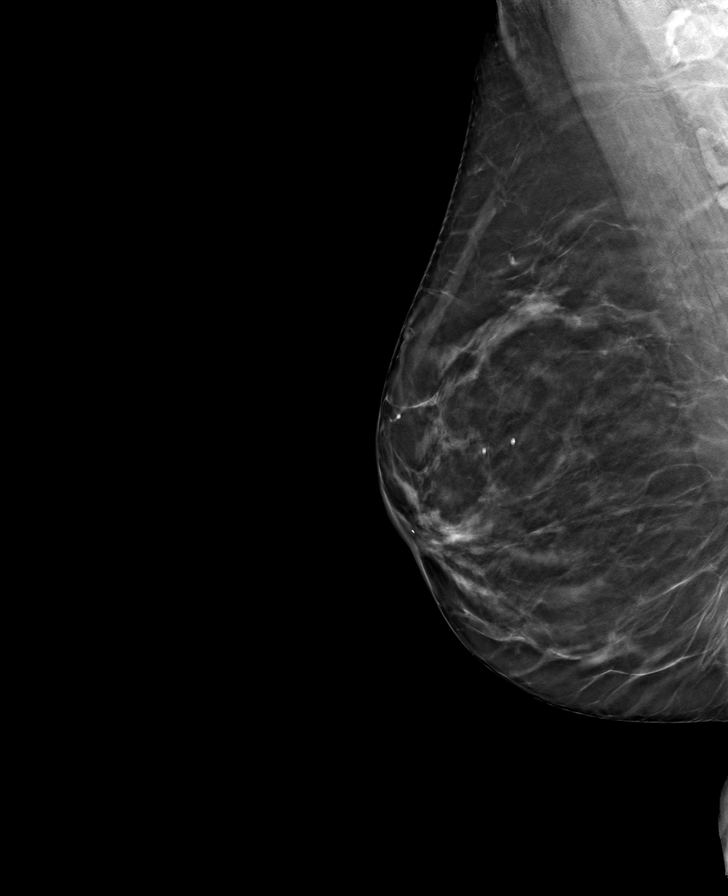

[R CC tomo · tomo slice 32/63.0]
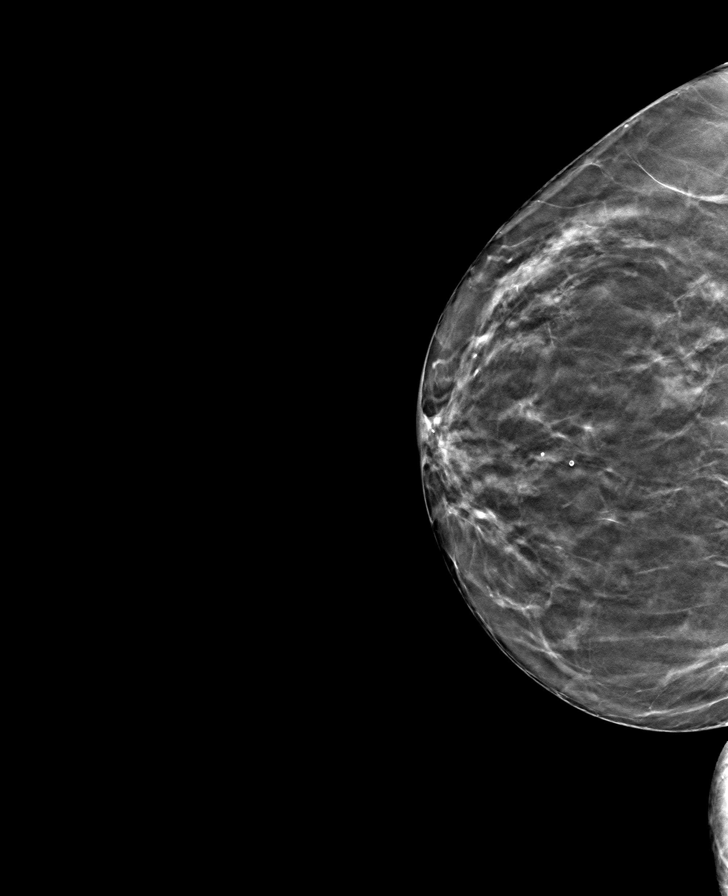

[L CC tomo · tomo slice 35/70.0]
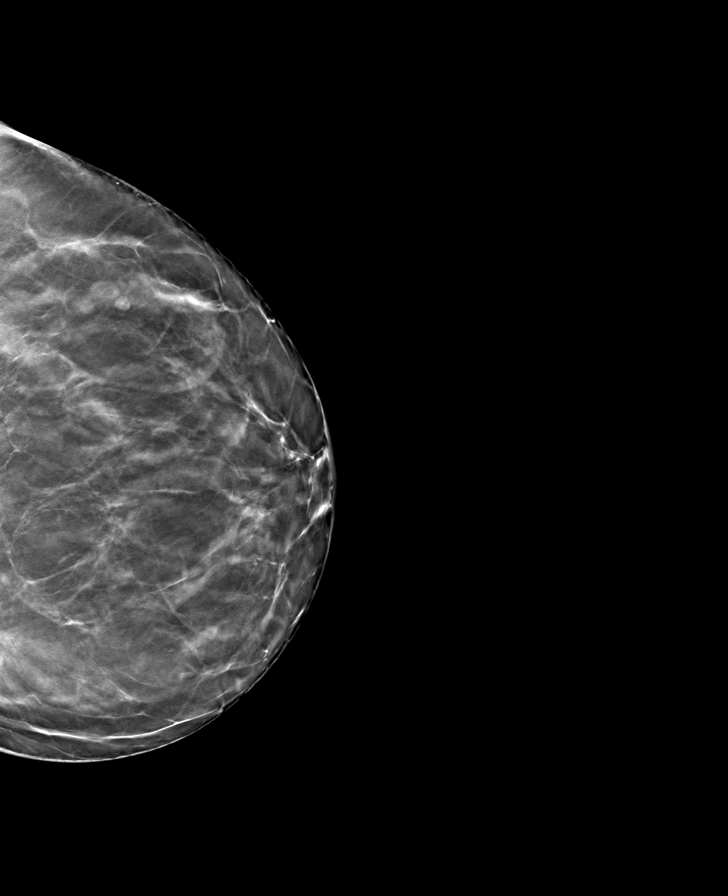

[8 of 24 positions shown; findings below may reference images not displayed]

ACR Breast Density Category b: There are scattered areas of
fibroglandular density.
FINDINGS: There are no findings suspicious for malignancy. Images were
processed with CAD.
IMPRESSION: No mammographic evidence of malignancy. A result letter of this
screening mammogram will be mailed directly to the patient.

RECOMMENDATION:
Screening mammogram in one year. (Code:[id number])

BI-RADS CATEGORY  1: Negative.

## 2022-01-25 DIAGNOSIS — Z1283 Encounter for screening for malignant neoplasm of skin: Secondary | ICD-10-CM | POA: Diagnosis not present

## 2022-01-25 DIAGNOSIS — L718 Other rosacea: Secondary | ICD-10-CM | POA: Diagnosis not present

## 2022-01-25 DIAGNOSIS — D225 Melanocytic nevi of trunk: Secondary | ICD-10-CM | POA: Diagnosis not present

## 2022-03-08 DIAGNOSIS — H04123 Dry eye syndrome of bilateral lacrimal glands: Secondary | ICD-10-CM | POA: Diagnosis not present

## 2022-03-08 DIAGNOSIS — H43393 Other vitreous opacities, bilateral: Secondary | ICD-10-CM | POA: Diagnosis not present

## 2022-03-08 DIAGNOSIS — H401131 Primary open-angle glaucoma, bilateral, mild stage: Secondary | ICD-10-CM | POA: Diagnosis not present

## 2022-03-08 DIAGNOSIS — H43813 Vitreous degeneration, bilateral: Secondary | ICD-10-CM | POA: Diagnosis not present

## 2022-03-24 DIAGNOSIS — Z6823 Body mass index (BMI) 23.0-23.9, adult: Secondary | ICD-10-CM | POA: Diagnosis not present

## 2022-03-24 DIAGNOSIS — Z20828 Contact with and (suspected) exposure to other viral communicable diseases: Secondary | ICD-10-CM | POA: Diagnosis not present

## 2022-03-24 DIAGNOSIS — R059 Cough, unspecified: Secondary | ICD-10-CM | POA: Diagnosis not present

## 2022-03-24 DIAGNOSIS — R030 Elevated blood-pressure reading, without diagnosis of hypertension: Secondary | ICD-10-CM | POA: Diagnosis not present

## 2022-03-24 DIAGNOSIS — J329 Chronic sinusitis, unspecified: Secondary | ICD-10-CM | POA: Diagnosis not present

## 2022-04-26 DIAGNOSIS — H0100B Unspecified blepharitis left eye, upper and lower eyelids: Secondary | ICD-10-CM | POA: Diagnosis not present

## 2022-04-26 DIAGNOSIS — H1045 Other chronic allergic conjunctivitis: Secondary | ICD-10-CM | POA: Diagnosis not present

## 2022-04-26 DIAGNOSIS — H0100A Unspecified blepharitis right eye, upper and lower eyelids: Secondary | ICD-10-CM | POA: Diagnosis not present

## 2022-04-26 DIAGNOSIS — H04123 Dry eye syndrome of bilateral lacrimal glands: Secondary | ICD-10-CM | POA: Diagnosis not present

## 2022-05-11 DIAGNOSIS — Z79899 Other long term (current) drug therapy: Secondary | ICD-10-CM | POA: Diagnosis not present

## 2022-05-11 DIAGNOSIS — E7801 Familial hypercholesterolemia: Secondary | ICD-10-CM | POA: Diagnosis not present

## 2022-05-11 DIAGNOSIS — G459 Transient cerebral ischemic attack, unspecified: Secondary | ICD-10-CM | POA: Diagnosis not present

## 2022-05-11 DIAGNOSIS — E785 Hyperlipidemia, unspecified: Secondary | ICD-10-CM | POA: Diagnosis not present

## 2022-05-11 DIAGNOSIS — E039 Hypothyroidism, unspecified: Secondary | ICD-10-CM | POA: Diagnosis not present

## 2022-05-17 DIAGNOSIS — M19041 Primary osteoarthritis, right hand: Secondary | ICD-10-CM | POA: Diagnosis not present

## 2022-05-17 DIAGNOSIS — E039 Hypothyroidism, unspecified: Secondary | ICD-10-CM | POA: Diagnosis not present

## 2022-05-17 DIAGNOSIS — R011 Cardiac murmur, unspecified: Secondary | ICD-10-CM | POA: Diagnosis not present

## 2022-05-17 DIAGNOSIS — R030 Elevated blood-pressure reading, without diagnosis of hypertension: Secondary | ICD-10-CM | POA: Diagnosis not present

## 2022-05-17 DIAGNOSIS — M545 Low back pain, unspecified: Secondary | ICD-10-CM | POA: Diagnosis not present

## 2022-05-17 DIAGNOSIS — I350 Nonrheumatic aortic (valve) stenosis: Secondary | ICD-10-CM | POA: Diagnosis not present

## 2022-05-17 DIAGNOSIS — Z6823 Body mass index (BMI) 23.0-23.9, adult: Secondary | ICD-10-CM | POA: Diagnosis not present

## 2022-05-17 DIAGNOSIS — Z23 Encounter for immunization: Secondary | ICD-10-CM | POA: Diagnosis not present

## 2022-05-17 DIAGNOSIS — G4700 Insomnia, unspecified: Secondary | ICD-10-CM | POA: Diagnosis not present

## 2022-05-17 DIAGNOSIS — Q210 Ventricular septal defect: Secondary | ICD-10-CM | POA: Diagnosis not present

## 2022-05-17 DIAGNOSIS — F419 Anxiety disorder, unspecified: Secondary | ICD-10-CM | POA: Diagnosis not present

## 2022-05-17 DIAGNOSIS — E785 Hyperlipidemia, unspecified: Secondary | ICD-10-CM | POA: Diagnosis not present

## 2022-05-18 DIAGNOSIS — R030 Elevated blood-pressure reading, without diagnosis of hypertension: Secondary | ICD-10-CM | POA: Diagnosis not present

## 2022-05-18 DIAGNOSIS — I350 Nonrheumatic aortic (valve) stenosis: Secondary | ICD-10-CM | POA: Diagnosis not present

## 2022-05-18 DIAGNOSIS — E039 Hypothyroidism, unspecified: Secondary | ICD-10-CM | POA: Diagnosis not present

## 2022-05-18 DIAGNOSIS — E785 Hyperlipidemia, unspecified: Secondary | ICD-10-CM | POA: Diagnosis not present

## 2022-05-18 DIAGNOSIS — Z0001 Encounter for general adult medical examination with abnormal findings: Secondary | ICD-10-CM | POA: Diagnosis not present

## 2022-05-18 DIAGNOSIS — R011 Cardiac murmur, unspecified: Secondary | ICD-10-CM | POA: Diagnosis not present

## 2022-05-30 DIAGNOSIS — H5711 Ocular pain, right eye: Secondary | ICD-10-CM | POA: Diagnosis not present

## 2022-05-30 DIAGNOSIS — L309 Dermatitis, unspecified: Secondary | ICD-10-CM | POA: Diagnosis not present

## 2022-05-31 DIAGNOSIS — J019 Acute sinusitis, unspecified: Secondary | ICD-10-CM | POA: Diagnosis not present

## 2022-05-31 DIAGNOSIS — R030 Elevated blood-pressure reading, without diagnosis of hypertension: Secondary | ICD-10-CM | POA: Diagnosis not present

## 2022-05-31 DIAGNOSIS — Z6824 Body mass index (BMI) 24.0-24.9, adult: Secondary | ICD-10-CM | POA: Diagnosis not present

## 2022-06-02 DIAGNOSIS — R519 Headache, unspecified: Secondary | ICD-10-CM | POA: Diagnosis not present

## 2022-06-02 DIAGNOSIS — R030 Elevated blood-pressure reading, without diagnosis of hypertension: Secondary | ICD-10-CM | POA: Diagnosis not present

## 2022-06-02 DIAGNOSIS — Z6824 Body mass index (BMI) 24.0-24.9, adult: Secondary | ICD-10-CM | POA: Diagnosis not present

## 2022-06-03 DIAGNOSIS — B0239 Other herpes zoster eye disease: Secondary | ICD-10-CM | POA: Diagnosis not present

## 2022-06-21 DIAGNOSIS — L309 Dermatitis, unspecified: Secondary | ICD-10-CM | POA: Diagnosis not present

## 2022-06-21 DIAGNOSIS — H5711 Ocular pain, right eye: Secondary | ICD-10-CM | POA: Diagnosis not present

## 2022-07-05 DIAGNOSIS — B0222 Postherpetic trigeminal neuralgia: Secondary | ICD-10-CM | POA: Diagnosis not present

## 2022-07-05 DIAGNOSIS — B0239 Other herpes zoster eye disease: Secondary | ICD-10-CM | POA: Diagnosis not present

## 2022-07-16 DIAGNOSIS — L309 Dermatitis, unspecified: Secondary | ICD-10-CM | POA: Diagnosis not present

## 2022-07-16 DIAGNOSIS — H5711 Ocular pain, right eye: Secondary | ICD-10-CM | POA: Diagnosis not present

## 2022-07-16 DIAGNOSIS — H04123 Dry eye syndrome of bilateral lacrimal glands: Secondary | ICD-10-CM | POA: Diagnosis not present

## 2022-07-19 DIAGNOSIS — B0222 Postherpetic trigeminal neuralgia: Secondary | ICD-10-CM | POA: Diagnosis not present

## 2022-07-19 DIAGNOSIS — B0239 Other herpes zoster eye disease: Secondary | ICD-10-CM | POA: Diagnosis not present

## 2022-07-26 ENCOUNTER — Other Ambulatory Visit: Payer: Self-pay | Admitting: Physician Assistant

## 2022-07-26 DIAGNOSIS — Z1231 Encounter for screening mammogram for malignant neoplasm of breast: Secondary | ICD-10-CM

## 2022-07-31 DIAGNOSIS — B0222 Postherpetic trigeminal neuralgia: Secondary | ICD-10-CM | POA: Diagnosis not present

## 2022-07-31 DIAGNOSIS — R519 Headache, unspecified: Secondary | ICD-10-CM | POA: Diagnosis not present

## 2022-07-31 DIAGNOSIS — B0239 Other herpes zoster eye disease: Secondary | ICD-10-CM | POA: Diagnosis not present

## 2022-08-04 DIAGNOSIS — R030 Elevated blood-pressure reading, without diagnosis of hypertension: Secondary | ICD-10-CM | POA: Diagnosis not present

## 2022-08-04 DIAGNOSIS — L259 Unspecified contact dermatitis, unspecified cause: Secondary | ICD-10-CM | POA: Diagnosis not present

## 2022-08-04 DIAGNOSIS — Z6824 Body mass index (BMI) 24.0-24.9, adult: Secondary | ICD-10-CM | POA: Diagnosis not present

## 2022-08-06 ENCOUNTER — Ambulatory Visit
Admission: RE | Admit: 2022-08-06 | Discharge: 2022-08-06 | Disposition: A | Discharge status: Home / Self Care | Payer: Medicare Other | Source: Ambulatory Visit | Attending: Physician Assistant | Admitting: Physician Assistant

## 2022-08-06 DIAGNOSIS — Z1231 Encounter for screening mammogram for malignant neoplasm of breast: Secondary | ICD-10-CM

## 2022-08-21 ENCOUNTER — Ambulatory Visit: Payer: Medicare Other | Attending: Cardiology | Admitting: Cardiology

## 2022-08-21 ENCOUNTER — Encounter: Payer: Self-pay | Admitting: Cardiology

## 2022-08-21 VITALS — BP 110/66 | HR 65 | Ht 62.0 in | Wt 131.4 lb

## 2022-08-21 DIAGNOSIS — I340 Nonrheumatic mitral (valve) insufficiency: Secondary | ICD-10-CM | POA: Insufficient documentation

## 2022-08-21 DIAGNOSIS — Q210 Ventricular septal defect: Secondary | ICD-10-CM | POA: Diagnosis not present

## 2022-08-21 NOTE — Progress Notes (Signed)
    Cardiology Office Note  Date: 08/21/2022   ID: DEBBORA ANG, DOB 25-Feb-1950, MRN [id number]  History of Present Illness: [name] is a 72 y.o. female last seen in February 2023.  She is here for a routine visit.  Overall has been doing well from a cardiac perspective.  She tells me that she had RSV earlier in the year and more recently a bout of shingles.  She is still recuperating.  Her last echocardiogram was in 2022.  We discussed getting an updated study mainly to assess RV size and PASP if possible, reevaluate mitral regurgitation.  I reviewed her lab work from last year.  She continues to follow at [facility].  Physical Exam: VS:  BP 110/66   Pulse 65   Ht 5\' 2"  (1.575 m)   Wt 131 lb 6.4 oz (59.6 kg)   SpO2 96%   BMI 24.03 kg/m , BMI Body mass index is 24.03 kg/m.  Wt Readings from Last 3 Encounters:  08/21/22 131 lb 6.4 oz (59.6 kg)  05/15/21 130 lb 9.6 oz (59.2 kg)  03/16/21 128 lb 6.4 oz (58.2 kg)    General: Patient appears comfortable at rest. HEENT: Conjunctiva and lids normal. Neck: Supple, no elevated JVP or carotid bruits. Lungs: Clear to auscultation, nonlabored breathing at rest. Cardiac: Regular rate and rhythm, no S3, 3/6 systolic murmur. Extremities: No pitting edema.  ECG:  An ECG dated 03/16/2021 was personally reviewed today and demonstrated:  Sinus rhythm.  Labwork:  April 2023: Hemoglobin 14.1, platelets 171, BUN 16, creatinine 0.81, potassium 4.3, AST 25, ALT 20  Other Studies Reviewed Today:  No interval cardiac testing for review today.  Assessment and Plan:  1.  Perimembranous VSD, stable by echocardiogram in February 2022 with mild left to right shunting, normal RV contraction.  Plan to get an updated echocardiogram to reevaluate RV size and if possible PASP.  2.  Mitral regurgitation, mild by follow-up echocardiogram in February 2022.  Disposition:  Follow up  1 year.  Signed, Jonelle Sidle, M.D., F.A.C.C. [facility]

## 2022-08-21 NOTE — Patient Instructions (Addendum)
Medication Instructions:  Your physician recommends that you continue on your current medications as directed. Please refer to the Current Medication list given to you today.  Labwork: none  Testing/Procedures: Your physician has requested that you have an echocardiogram. Echocardiography is a painless test that uses sound waves to create images of your heart. It provides your doctor with information about the size and shape of your heart and how well your heart's chambers and valves are working. This procedure takes approximately one hour. There are no restrictions for this procedure. Please do NOT wear cologne, perfume, aftershave, or lotions (deodorant is allowed). Please arrive 15 minutes prior to your appointment time.  Follow-Up: Your physician recommends that you schedule a follow-up appointment in: 1 year. You will receive a reminder call in about 10 months reminding you to schedule your appointment. If you don't receive this call, please contact our office.  Any Other Special Instructions Will Be Listed Below (If Applicable).  If you need a refill on your cardiac medications before your next appointment, please call your pharmacy.

## 2022-08-30 DIAGNOSIS — R519 Headache, unspecified: Secondary | ICD-10-CM | POA: Diagnosis not present

## 2022-08-30 DIAGNOSIS — B0222 Postherpetic trigeminal neuralgia: Secondary | ICD-10-CM | POA: Diagnosis not present

## 2022-08-30 DIAGNOSIS — B0239 Other herpes zoster eye disease: Secondary | ICD-10-CM | POA: Diagnosis not present

## 2022-09-12 ENCOUNTER — Ambulatory Visit: Payer: Medicare Other | Attending: Cardiology

## 2022-09-12 DIAGNOSIS — I340 Nonrheumatic mitral (valve) insufficiency: Secondary | ICD-10-CM | POA: Insufficient documentation

## 2022-09-12 DIAGNOSIS — Q210 Ventricular septal defect: Secondary | ICD-10-CM | POA: Diagnosis not present

## 2022-09-12 LAB — ECHOCARDIOGRAM COMPLETE
Area-P 1/2: 3.03 cm2
Calc EF: 59.7 %
MV M vel: 4.88 m/s
MV Peak grad: 95.3 mmHg
S' Lateral: 2.9 cm
Single Plane A2C EF: 54.7 %
Single Plane A4C EF: 66 %

## 2022-09-13 ENCOUNTER — Telehealth: Payer: Self-pay | Admitting: Cardiology

## 2022-09-13 NOTE — Telephone Encounter (Signed)
Pt returning call for echo results  

## 2022-09-13 NOTE — Telephone Encounter (Signed)
-----   Message from Nona Dell sent at 09/12/2022  5:30 PM EDT ----- Results reviewed.  Stable echocardiographic findings, LVEF normal at 55 to 60% and perimembranous VSD stable.  Mitral regurgitation is mild to moderate.  Keep follow-up as scheduled.

## 2022-09-13 NOTE — Telephone Encounter (Signed)
Patient notified and verbalized understanding. Patient had no questions or concerns at this time. PCP copied 

## 2022-09-18 DIAGNOSIS — G500 Trigeminal neuralgia: Secondary | ICD-10-CM | POA: Diagnosis not present

## 2022-09-18 DIAGNOSIS — H04123 Dry eye syndrome of bilateral lacrimal glands: Secondary | ICD-10-CM | POA: Diagnosis not present

## 2022-09-18 DIAGNOSIS — H5711 Ocular pain, right eye: Secondary | ICD-10-CM | POA: Diagnosis not present

## 2022-09-20 DIAGNOSIS — Z1283 Encounter for screening for malignant neoplasm of skin: Secondary | ICD-10-CM | POA: Diagnosis not present

## 2022-09-20 DIAGNOSIS — L308 Other specified dermatitis: Secondary | ICD-10-CM | POA: Diagnosis not present

## 2022-09-20 DIAGNOSIS — L908 Other atrophic disorders of skin: Secondary | ICD-10-CM | POA: Diagnosis not present

## 2022-09-20 DIAGNOSIS — D225 Melanocytic nevi of trunk: Secondary | ICD-10-CM | POA: Diagnosis not present

## 2022-10-09 DIAGNOSIS — H5711 Ocular pain, right eye: Secondary | ICD-10-CM | POA: Diagnosis not present

## 2022-10-09 DIAGNOSIS — H04123 Dry eye syndrome of bilateral lacrimal glands: Secondary | ICD-10-CM | POA: Diagnosis not present

## 2022-10-09 DIAGNOSIS — H401131 Primary open-angle glaucoma, bilateral, mild stage: Secondary | ICD-10-CM | POA: Diagnosis not present

## 2022-10-09 DIAGNOSIS — G500 Trigeminal neuralgia: Secondary | ICD-10-CM | POA: Diagnosis not present

## 2022-12-04 DIAGNOSIS — R519 Headache, unspecified: Secondary | ICD-10-CM | POA: Diagnosis not present

## 2022-12-04 DIAGNOSIS — B0222 Postherpetic trigeminal neuralgia: Secondary | ICD-10-CM | POA: Diagnosis not present

## 2022-12-04 DIAGNOSIS — T50905A Adverse effect of unspecified drugs, medicaments and biological substances, initial encounter: Secondary | ICD-10-CM | POA: Diagnosis not present

## 2022-12-04 DIAGNOSIS — B0239 Other herpes zoster eye disease: Secondary | ICD-10-CM | POA: Diagnosis not present

## 2022-12-10 DIAGNOSIS — R030 Elevated blood-pressure reading, without diagnosis of hypertension: Secondary | ICD-10-CM | POA: Diagnosis not present

## 2022-12-10 DIAGNOSIS — Z6824 Body mass index (BMI) 24.0-24.9, adult: Secondary | ICD-10-CM | POA: Diagnosis not present

## 2022-12-10 DIAGNOSIS — J069 Acute upper respiratory infection, unspecified: Secondary | ICD-10-CM | POA: Diagnosis not present

## 2022-12-10 DIAGNOSIS — R059 Cough, unspecified: Secondary | ICD-10-CM | POA: Diagnosis not present

## 2022-12-13 DIAGNOSIS — J019 Acute sinusitis, unspecified: Secondary | ICD-10-CM | POA: Diagnosis not present

## 2022-12-13 DIAGNOSIS — Z6824 Body mass index (BMI) 24.0-24.9, adult: Secondary | ICD-10-CM | POA: Diagnosis not present

## 2022-12-13 DIAGNOSIS — R059 Cough, unspecified: Secondary | ICD-10-CM | POA: Diagnosis not present

## 2023-01-29 DIAGNOSIS — Z6824 Body mass index (BMI) 24.0-24.9, adult: Secondary | ICD-10-CM | POA: Diagnosis not present

## 2023-01-29 DIAGNOSIS — J0100 Acute maxillary sinusitis, unspecified: Secondary | ICD-10-CM | POA: Diagnosis not present

## 2023-01-29 DIAGNOSIS — R030 Elevated blood-pressure reading, without diagnosis of hypertension: Secondary | ICD-10-CM | POA: Diagnosis not present

## 2023-02-07 ENCOUNTER — Encounter: Payer: Self-pay | Admitting: *Deleted

## 2023-02-19 DIAGNOSIS — B0239 Other herpes zoster eye disease: Secondary | ICD-10-CM | POA: Diagnosis not present

## 2023-02-19 DIAGNOSIS — B0222 Postherpetic trigeminal neuralgia: Secondary | ICD-10-CM | POA: Diagnosis not present

## 2023-02-19 DIAGNOSIS — R519 Headache, unspecified: Secondary | ICD-10-CM | POA: Diagnosis not present

## 2023-02-19 DIAGNOSIS — T50905A Adverse effect of unspecified drugs, medicaments and biological substances, initial encounter: Secondary | ICD-10-CM | POA: Diagnosis not present

## 2023-02-28 DIAGNOSIS — M654 Radial styloid tenosynovitis [de Quervain]: Secondary | ICD-10-CM | POA: Diagnosis not present

## 2023-04-11 DIAGNOSIS — H401131 Primary open-angle glaucoma, bilateral, mild stage: Secondary | ICD-10-CM | POA: Diagnosis not present

## 2023-04-11 DIAGNOSIS — G500 Trigeminal neuralgia: Secondary | ICD-10-CM | POA: Diagnosis not present

## 2023-04-11 DIAGNOSIS — H5711 Ocular pain, right eye: Secondary | ICD-10-CM | POA: Diagnosis not present

## 2023-04-11 DIAGNOSIS — H04123 Dry eye syndrome of bilateral lacrimal glands: Secondary | ICD-10-CM | POA: Diagnosis not present

## 2023-05-15 DIAGNOSIS — Z0000 Encounter for general adult medical examination without abnormal findings: Secondary | ICD-10-CM | POA: Diagnosis not present

## 2023-05-15 DIAGNOSIS — E038 Other specified hypothyroidism: Secondary | ICD-10-CM | POA: Diagnosis not present

## 2023-05-15 DIAGNOSIS — E039 Hypothyroidism, unspecified: Secondary | ICD-10-CM | POA: Diagnosis not present

## 2023-05-15 DIAGNOSIS — E785 Hyperlipidemia, unspecified: Secondary | ICD-10-CM | POA: Diagnosis not present

## 2023-05-23 DIAGNOSIS — Z0000 Encounter for general adult medical examination without abnormal findings: Secondary | ICD-10-CM | POA: Diagnosis not present

## 2023-05-23 DIAGNOSIS — R300 Dysuria: Secondary | ICD-10-CM | POA: Diagnosis not present

## 2023-05-23 DIAGNOSIS — D519 Vitamin B12 deficiency anemia, unspecified: Secondary | ICD-10-CM | POA: Diagnosis not present

## 2023-05-23 DIAGNOSIS — Z1331 Encounter for screening for depression: Secondary | ICD-10-CM | POA: Diagnosis not present

## 2023-05-23 DIAGNOSIS — F419 Anxiety disorder, unspecified: Secondary | ICD-10-CM | POA: Diagnosis not present

## 2023-05-23 DIAGNOSIS — Z1389 Encounter for screening for other disorder: Secondary | ICD-10-CM | POA: Diagnosis not present

## 2023-05-23 DIAGNOSIS — J309 Allergic rhinitis, unspecified: Secondary | ICD-10-CM | POA: Diagnosis not present

## 2023-05-23 DIAGNOSIS — Z0001 Encounter for general adult medical examination with abnormal findings: Secondary | ICD-10-CM | POA: Diagnosis not present

## 2023-05-23 DIAGNOSIS — E039 Hypothyroidism, unspecified: Secondary | ICD-10-CM | POA: Diagnosis not present

## 2023-05-23 DIAGNOSIS — G4700 Insomnia, unspecified: Secondary | ICD-10-CM | POA: Diagnosis not present

## 2023-06-13 DIAGNOSIS — J209 Acute bronchitis, unspecified: Secondary | ICD-10-CM | POA: Diagnosis not present

## 2023-06-13 DIAGNOSIS — Z6824 Body mass index (BMI) 24.0-24.9, adult: Secondary | ICD-10-CM | POA: Diagnosis not present

## 2023-06-13 DIAGNOSIS — R051 Acute cough: Secondary | ICD-10-CM | POA: Diagnosis not present

## 2023-06-13 DIAGNOSIS — J019 Acute sinusitis, unspecified: Secondary | ICD-10-CM | POA: Diagnosis not present

## 2023-06-20 DIAGNOSIS — M5412 Radiculopathy, cervical region: Secondary | ICD-10-CM | POA: Diagnosis not present

## 2023-06-20 DIAGNOSIS — M47812 Spondylosis without myelopathy or radiculopathy, cervical region: Secondary | ICD-10-CM | POA: Diagnosis not present

## 2023-06-20 DIAGNOSIS — M542 Cervicalgia: Secondary | ICD-10-CM | POA: Diagnosis not present

## 2023-06-26 DIAGNOSIS — H26493 Other secondary cataract, bilateral: Secondary | ICD-10-CM | POA: Diagnosis not present

## 2023-07-16 ENCOUNTER — Other Ambulatory Visit: Payer: Self-pay | Admitting: Physician Assistant

## 2023-07-16 DIAGNOSIS — Z1231 Encounter for screening mammogram for malignant neoplasm of breast: Secondary | ICD-10-CM

## 2023-07-29 DIAGNOSIS — H43813 Vitreous degeneration, bilateral: Secondary | ICD-10-CM | POA: Diagnosis not present

## 2023-08-06 DIAGNOSIS — H60392 Other infective otitis externa, left ear: Secondary | ICD-10-CM | POA: Diagnosis not present

## 2023-08-06 DIAGNOSIS — Z6824 Body mass index (BMI) 24.0-24.9, adult: Secondary | ICD-10-CM | POA: Diagnosis not present

## 2023-08-19 ENCOUNTER — Ambulatory Visit: Attending: Cardiology | Admitting: Cardiology

## 2023-08-19 ENCOUNTER — Encounter: Payer: Self-pay | Admitting: Cardiology

## 2023-08-19 VITALS — BP 128/80 | HR 68 | Ht 62.0 in | Wt 138.0 lb

## 2023-08-19 DIAGNOSIS — I340 Nonrheumatic mitral (valve) insufficiency: Secondary | ICD-10-CM | POA: Insufficient documentation

## 2023-08-19 DIAGNOSIS — Q210 Ventricular septal defect: Secondary | ICD-10-CM | POA: Insufficient documentation

## 2023-08-19 DIAGNOSIS — Z136 Encounter for screening for cardiovascular disorders: Secondary | ICD-10-CM | POA: Diagnosis not present

## 2023-08-19 NOTE — Patient Instructions (Addendum)

## 2023-08-19 NOTE — Progress Notes (Signed)
    Cardiology Office Note  Date: 08/19/2023   ID: [name], DOB 1950/06/16, MRN [id number]  History of Present Illness: [name] is a 73 y.o. female last seen in July 2024.  She is here for a follow-up visit.  Just got back from a 2-week trip to [place]  with her family.  She does not report any exertional chest pain or unusual shortness of breath with typical activities.  [name] palpitations or syncope.  We went over her medications.  She continues to follow with [facility].  I reviewed her ECG today which shows sinus rhythm with PAC and nonspecific ST changes.  I went over her echocardiogram from August 2024 as well.  Physical Exam: VS:  BP 128/80   Pulse 68   Ht 5' 2 (1.575 m)   Wt 138 lb (62.6 kg)   SpO2 95%   BMI 25.24 kg/m , BMI Body mass index is 25.24 kg/m.  Wt Readings from Last 3 Encounters:  08/19/23 138 lb (62.6 kg)  08/21/22 131 lb 6.4 oz (59.6 kg)  05/15/21 130 lb 9.6 oz (59.2 kg)    General: Patient appears comfortable at rest. HEENT: Conjunctiva and lids normal. Neck: Supple, [name] elevated JVP or carotid bruits. Lungs: Clear to auscultation, nonlabored breathing at rest. Cardiac: Regular rate and rhythm, [name] S3, 2-3/6 systolic murmur, [name] pericardial rub.  ECG:  An ECG dated 08/21/2022 was personally reviewed today and demonstrated:  Sinus bradycardia.  Labwork:  April 2023: Hemoglobin 14.1, platelets 171, BUN 16, creatinine 0.81, potassium 4.3, AST 25, ALT 20   Other Studies Reviewed Today:  [name] interval cardiac testing for review today.  Assessment and Plan:  1.  Perimembranous VSD.  Follow-up echocardiogram in August 2024 revealed normal LV and RV contraction with stable left-to-right shunt and normal estimated RVSP.  She remains asymptomatic.  ECG reviewed.   2.  Mitral regurgitation, mild to moderate by follow-up echocardiogram in August 2024.  Will continue observation over time.  Disposition:  Follow up 1 year.  Signed, Jayson JUDITHANN Sierras,  M.D., F.A.C.C. [facility]

## 2023-08-20 ENCOUNTER — Inpatient Hospital Stay
Admission: RE | Admit: 2023-08-20 | Discharge: 2023-08-20 | Discharge status: Home / Self Care | Source: Ambulatory Visit | Attending: Physician Assistant | Admitting: Physician Assistant

## 2023-08-20 DIAGNOSIS — Z1231 Encounter for screening mammogram for malignant neoplasm of breast: Secondary | ICD-10-CM

## 2023-08-22 DIAGNOSIS — L308 Other specified dermatitis: Secondary | ICD-10-CM | POA: Diagnosis not present

## 2023-08-22 DIAGNOSIS — Z1283 Encounter for screening for malignant neoplasm of skin: Secondary | ICD-10-CM | POA: Diagnosis not present

## 2023-08-22 DIAGNOSIS — D225 Melanocytic nevi of trunk: Secondary | ICD-10-CM | POA: Diagnosis not present

## 2023-08-22 DIAGNOSIS — L908 Other atrophic disorders of skin: Secondary | ICD-10-CM | POA: Diagnosis not present

## 2023-08-26 ENCOUNTER — Encounter: Payer: Self-pay | Admitting: Physician Assistant

## 2023-08-26 ENCOUNTER — Other Ambulatory Visit: Payer: Self-pay | Admitting: Physician Assistant

## 2023-08-26 DIAGNOSIS — R928 Other abnormal and inconclusive findings on diagnostic imaging of breast: Secondary | ICD-10-CM

## 2023-08-27 DIAGNOSIS — H26492 Other secondary cataract, left eye: Secondary | ICD-10-CM | POA: Diagnosis not present

## 2023-08-28 DIAGNOSIS — E039 Hypothyroidism, unspecified: Secondary | ICD-10-CM | POA: Diagnosis not present

## 2023-09-03 ENCOUNTER — Ambulatory Visit
Admission: RE | Admit: 2023-09-03 | Discharge: 2023-09-03 | Disposition: A | Discharge status: Home / Self Care | Source: Ambulatory Visit | Attending: Physician Assistant | Admitting: Physician Assistant

## 2023-09-03 DIAGNOSIS — R928 Other abnormal and inconclusive findings on diagnostic imaging of breast: Secondary | ICD-10-CM | POA: Diagnosis not present

## 2023-09-03 DIAGNOSIS — R92321 Mammographic fibroglandular density, right breast: Secondary | ICD-10-CM | POA: Diagnosis not present

## 2023-09-03 DIAGNOSIS — N6041 Mammary duct ectasia of right breast: Secondary | ICD-10-CM | POA: Diagnosis not present

## 2023-09-03 DIAGNOSIS — N6315 Unspecified lump in the right breast, overlapping quadrants: Secondary | ICD-10-CM | POA: Diagnosis not present

## 2023-10-15 DIAGNOSIS — H04123 Dry eye syndrome of bilateral lacrimal glands: Secondary | ICD-10-CM | POA: Diagnosis not present

## 2023-10-15 DIAGNOSIS — H401131 Primary open-angle glaucoma, bilateral, mild stage: Secondary | ICD-10-CM | POA: Diagnosis not present

## 2023-10-28 DIAGNOSIS — M2550 Pain in unspecified joint: Secondary | ICD-10-CM | POA: Diagnosis not present

## 2023-10-28 DIAGNOSIS — R4702 Dysphasia: Secondary | ICD-10-CM | POA: Diagnosis not present

## 2023-10-28 DIAGNOSIS — E559 Vitamin D deficiency, unspecified: Secondary | ICD-10-CM | POA: Diagnosis not present

## 2023-10-28 DIAGNOSIS — D519 Vitamin B12 deficiency anemia, unspecified: Secondary | ICD-10-CM | POA: Diagnosis not present

## 2023-10-28 DIAGNOSIS — E039 Hypothyroidism, unspecified: Secondary | ICD-10-CM | POA: Diagnosis not present

## 2023-10-28 DIAGNOSIS — D529 Folate deficiency anemia, unspecified: Secondary | ICD-10-CM | POA: Diagnosis not present

## 2023-10-28 DIAGNOSIS — Z6825 Body mass index (BMI) 25.0-25.9, adult: Secondary | ICD-10-CM | POA: Diagnosis not present

## 2023-10-28 DIAGNOSIS — J019 Acute sinusitis, unspecified: Secondary | ICD-10-CM | POA: Diagnosis not present

## 2023-10-28 DIAGNOSIS — R5383 Other fatigue: Secondary | ICD-10-CM | POA: Diagnosis not present

## 2023-11-04 DIAGNOSIS — M542 Cervicalgia: Secondary | ICD-10-CM | POA: Diagnosis not present

## 2023-11-04 DIAGNOSIS — M25511 Pain in right shoulder: Secondary | ICD-10-CM | POA: Diagnosis not present

## 2023-11-04 DIAGNOSIS — M25512 Pain in left shoulder: Secondary | ICD-10-CM | POA: Diagnosis not present

## 2023-11-04 DIAGNOSIS — M47812 Spondylosis without myelopathy or radiculopathy, cervical region: Secondary | ICD-10-CM | POA: Diagnosis not present

## 2023-11-04 DIAGNOSIS — G8929 Other chronic pain: Secondary | ICD-10-CM | POA: Diagnosis not present

## 2023-11-04 DIAGNOSIS — M5412 Radiculopathy, cervical region: Secondary | ICD-10-CM | POA: Diagnosis not present

## 2023-11-22 DIAGNOSIS — M47812 Spondylosis without myelopathy or radiculopathy, cervical region: Secondary | ICD-10-CM | POA: Diagnosis not present

## 2023-11-22 DIAGNOSIS — M5412 Radiculopathy, cervical region: Secondary | ICD-10-CM | POA: Diagnosis not present

## 2023-11-22 DIAGNOSIS — M5030 Other cervical disc degeneration, unspecified cervical region: Secondary | ICD-10-CM | POA: Diagnosis not present

## 2023-11-22 DIAGNOSIS — M4802 Spinal stenosis, cervical region: Secondary | ICD-10-CM | POA: Diagnosis not present

## 2023-11-22 DIAGNOSIS — M5021 Other cervical disc displacement,  high cervical region: Secondary | ICD-10-CM | POA: Diagnosis not present

## 2023-11-28 ENCOUNTER — Encounter: Payer: Self-pay | Admitting: Neurology

## 2023-12-12 DIAGNOSIS — B078 Other viral warts: Secondary | ICD-10-CM | POA: Diagnosis not present

## 2024-01-02 ENCOUNTER — Other Ambulatory Visit: Payer: Self-pay

## 2024-01-02 DIAGNOSIS — R202 Paresthesia of skin: Secondary | ICD-10-CM

## 2024-02-06 ENCOUNTER — Ambulatory Visit (INDEPENDENT_AMBULATORY_CARE_PROVIDER_SITE_OTHER): Admitting: Neurology

## 2024-02-06 DIAGNOSIS — G5602 Carpal tunnel syndrome, left upper limb: Secondary | ICD-10-CM

## 2024-02-06 DIAGNOSIS — R202 Paresthesia of skin: Secondary | ICD-10-CM | POA: Diagnosis not present

## 2024-02-06 NOTE — Procedures (Signed)
" °  [facility]  [address] [place], [address]  [place], [place] [address] Tel: [phone] Fax: [phone] Test Date:  02/06/2024  Patient: [name] DOB: [birth year] Physician: Tonita Blanch, DO  Sex: Female Height: 5' 2 Ref Phys: Manus Passe, MD  ID#: [id number]   Technician:    History: This is a 74 year old female referred for evaluation of left upper extremity paresthesias.  NCV & EMG Findings: Extensive electrodiagnostic testing of the left upper extremity shows:  Left median sensory response is absent.  Left ulnar sensory response is within normal limits. Left median motor response shows prolonged latency (5.3 ms) and reduced amplitude (1.7 mV).  Of note, there is a left Martin-Gruber anastomosis, a normal anatomic variant.  The left ulnar motor response is within normal limits.   Chronic motor axon loss changes are seen affecting the left abductor pollicis brevis muscle, without accompanying active denervation.    Impression: Left median neuropathy at or distal to the wrist, consistent with a clinical diagnosis of carpal tunnel syndrome.  Overall, these findings are severe in degree electrically.   ___________________________ Tonita Blanch, DO    Nerve Conduction Studies   Stim Site NR Peak (ms) Norm Peak (ms) O-P Amp (V) Norm O-P Amp  Left Median Anti Sensory (2nd Digit)  32 C  Wrist *NR  <3.8  >10  Left Ulnar Anti Sensory (5th Digit)  32 C  Wrist    3.0 <3.2 12.5 >5     Stim Site NR Onset (ms) Norm Onset (ms) O-P Amp (mV) Norm O-P Amp Site1 Site2 Delta-0 (ms) Dist (cm) Vel (m/s) Norm Vel (m/s)  Left Median Motor (Abd Poll Brev)  32 C  Wrist    *5.3 <4.0 *1.7 >5 Elbow Wrist 5.2 29.0 56 >50  Elbow    10.5  1.7  Ulnar-wrist crossover Elbow 6.6 0.0    Ulnar-wrist crossover    3.9  2.5         Left Ulnar Motor (Abd Dig Minimi)  32 C  Wrist    2.4 <3.1 8.9 >7 B Elbow Wrist 3.7 20.0 54 >50  B Elbow    6.1  8.6  A Elbow B Elbow 1.9 10.0 53 >50  A Elbow     8.0  8.0          Electromyography   Side Muscle Ins.Act Fibs Fasc Recrt Amp Dur Poly Activation Comment  Left 1stDorInt Nml Nml Nml Nml Nml Nml Nml Nml N/A  Left Abd Poll Brev Nml Nml Nml *3- *1+ *1+ *1+ Nml N/A  Left PronatorTeres Nml Nml Nml Nml Nml Nml Nml Nml N/A  Left Biceps Nml Nml Nml Nml Nml Nml Nml Nml N/A  Left Triceps Nml Nml Nml Nml Nml Nml Nml Nml N/A  Left Deltoid Nml Nml Nml Nml Nml Nml Nml Nml N/A      Waveforms:           "

## 2024-02-25 IMAGING — CT CT ABD-PELV W/ CM
2 of 6 series · 16 of 46 positions shown, 18 images · IV contrast (agent unspecified)
Comparison: Abdominopelvic CT 02/11/2013

CLINICAL DATA: Abdominal pain, acute, nonlocalized. Pelvic pain,
chronic, post menopausal bloating, change in bowels. Upper abdominal
pain with diarrhea for more than 1 year.

EXAM:
CT ABDOMEN AND PELVIS WITH CONTRAST
TECHNIQUE: Multidetector CT imaging of the abdomen and pelvis was performed
using the standard protocol following bolus administration of
intravenous contrast.

[Series 2: axial st · axial · 0.67mm/px · z∈[+1101,+1441]mm · 13 of 78 slices shown, 15 images]
[im 5/78  soft-tissue]
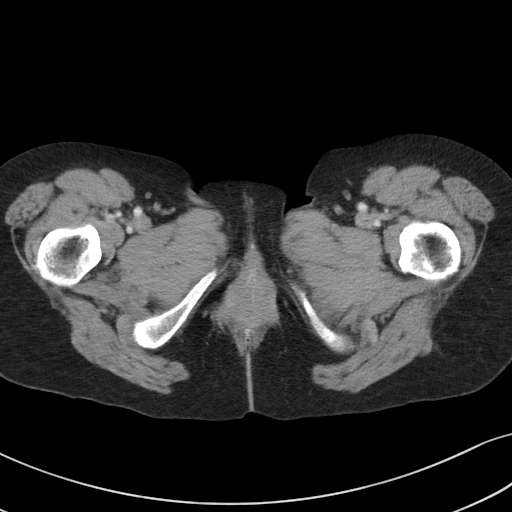
[im 5/78  bone]
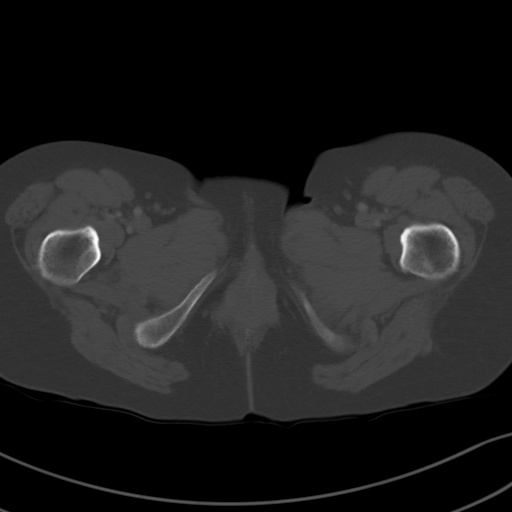
[im 10/78  soft-tissue]
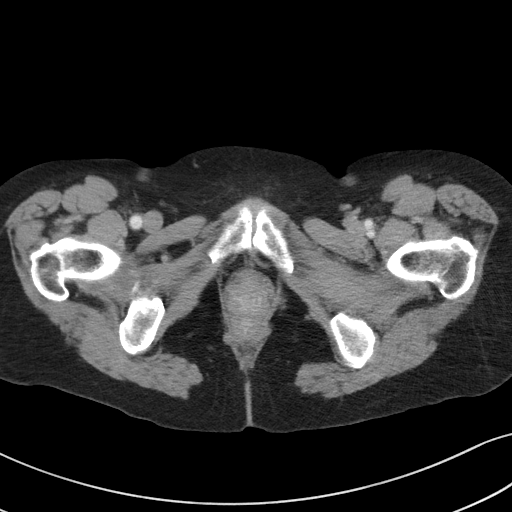
[im 19/78  soft-tissue]
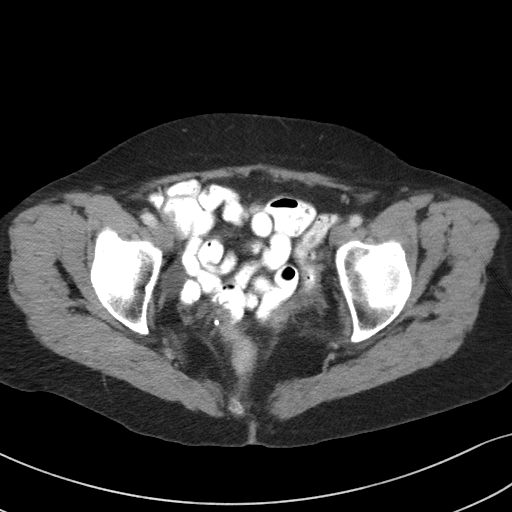
[im 23/78  soft-tissue]
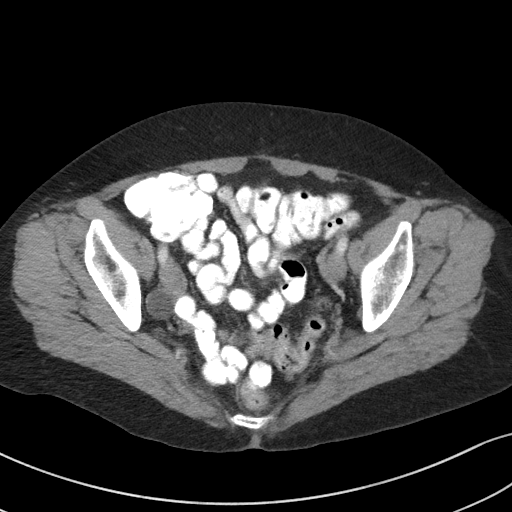
[im 28/78  soft-tissue]
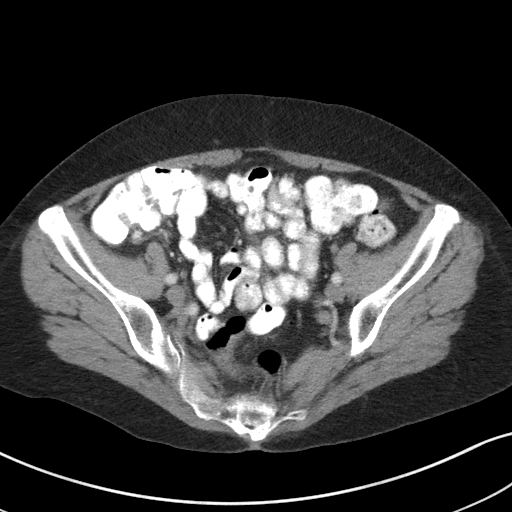
[im 32/78  soft-tissue]
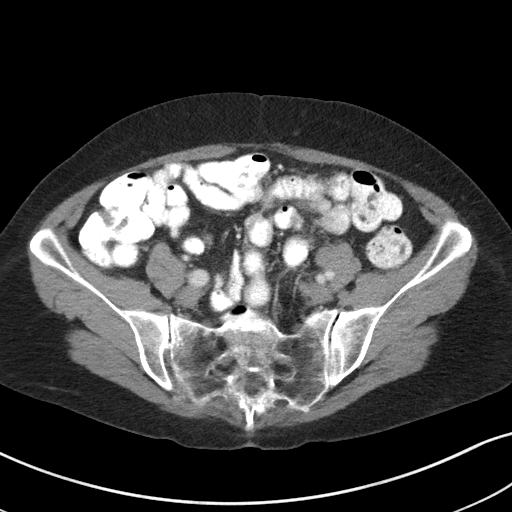
[im 41/78  soft-tissue]
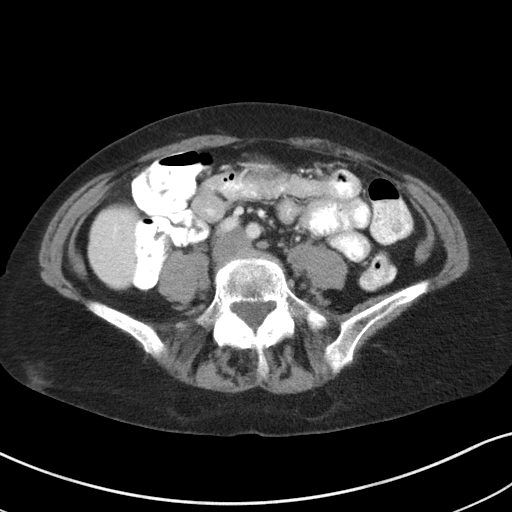
[im 46/78  soft-tissue]
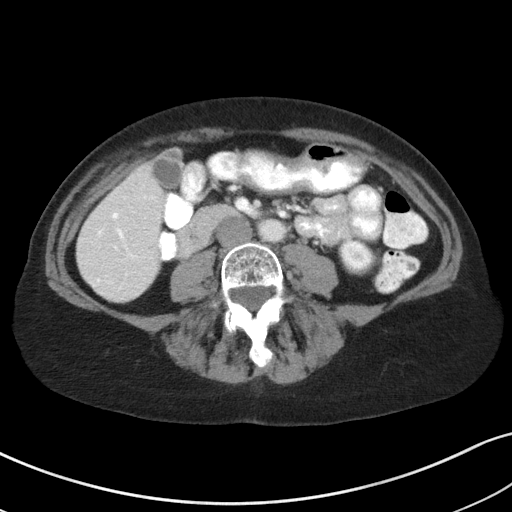
[im 50/78  soft-tissue]
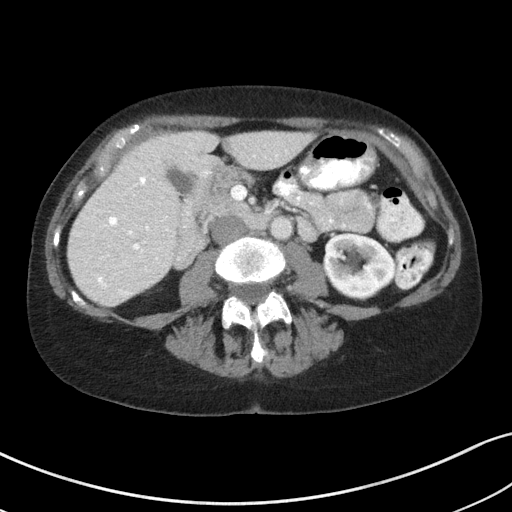
[im 50/78  bone]
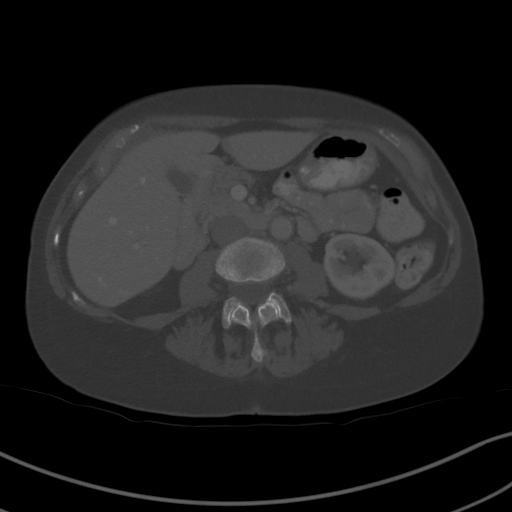
[im 55/78  soft-tissue]
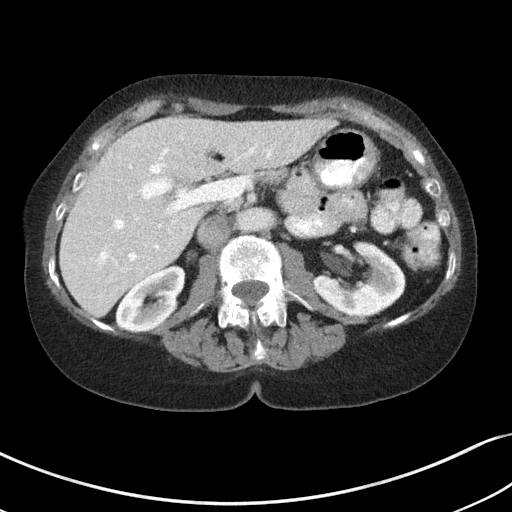
[im 59/78  soft-tissue]
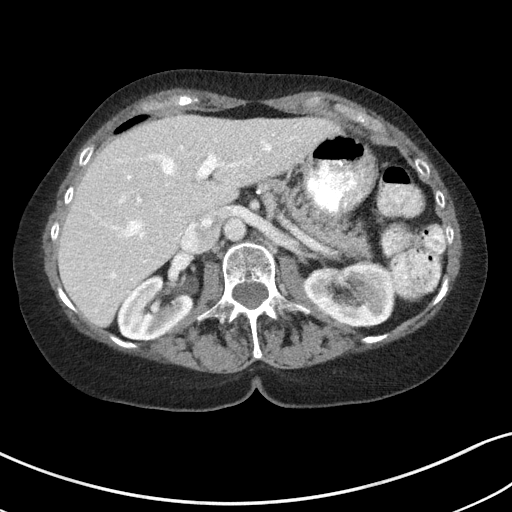
[im 68/78  soft-tissue]
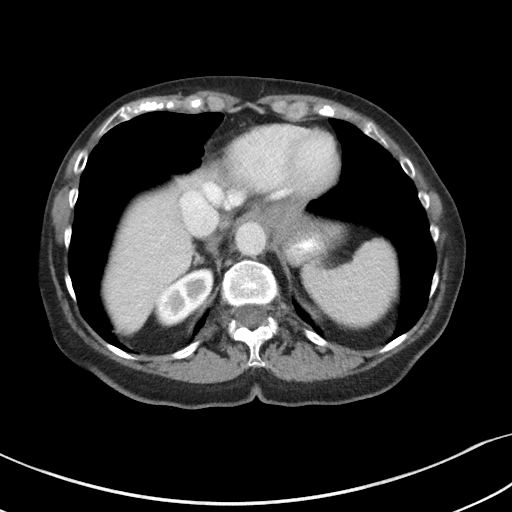
[im 73/78  soft-tissue]
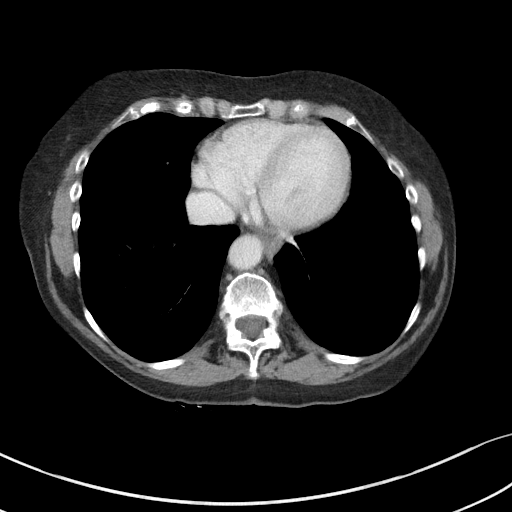

[Series 5: coronal st · coronal · 0.68mm/px · 3 of 90 slices shown]
[im 30/90  soft-tissue]
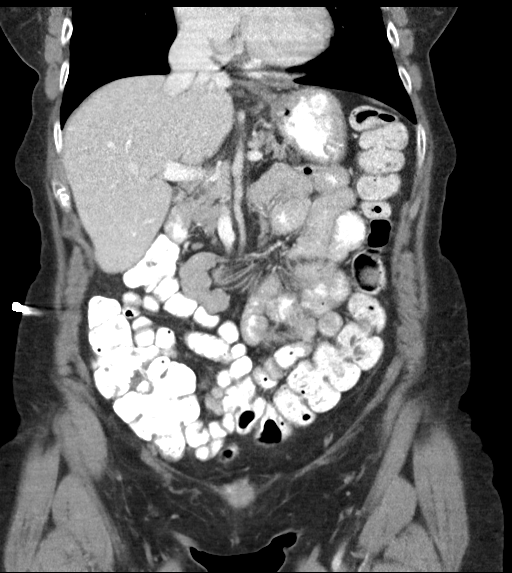
[im 40/90  soft-tissue]
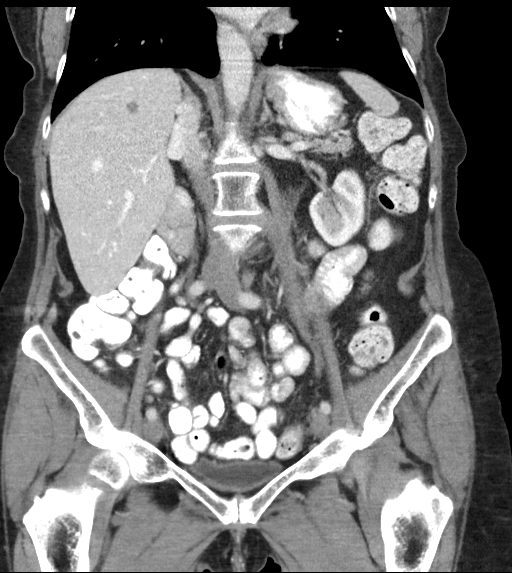
[im 50/90  soft-tissue]
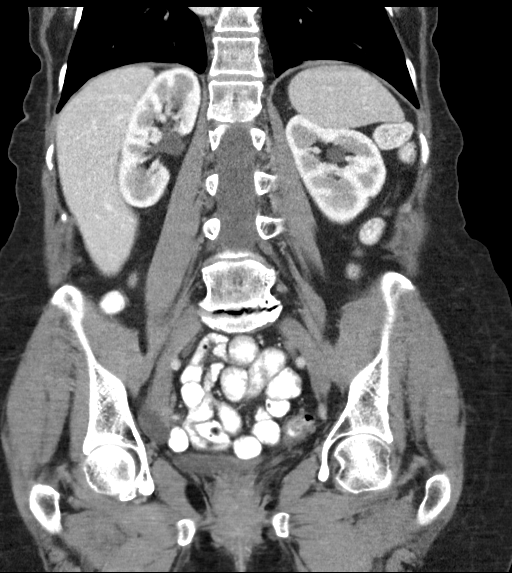

[16 of 46 positions shown; findings below may reference images not displayed]

RADIATION DOSE REDUCTION: This exam was performed according to the
departmental dose-optimization program which includes automated
exposure control, adjustment of the mA and/or kV according to
patient size and/or use of iterative reconstruction technique.

CONTRAST:  80mL OMNIPAQUE IOHEXOL 300 MG/ML  SOLN
FINDINGS: Lower chest: Stable mild linear scarring at both lung bases. The
lung bases are otherwise clear. No significant pleural or
pericardial effusion.

Hepatobiliary: The liver is normal in density without suspicious
focal abnormality. 7 mm low-density lesion posteriorly in the right
hepatic lobe on image [DATE] is only mildly larger than on the remote
prior study. This is not well seen on the delayed post-contrast
images, suggesting a hemangioma. No evidence of gallstones,
gallbladder wall thickening or biliary dilatation.

Pancreas: Unremarkable. No pancreatic ductal dilatation or
surrounding inflammatory changes.

Spleen: Normal in size without focal abnormality.

Adrenals/Urinary Tract: Both adrenal glands appear normal. The
kidneys appear normal without evidence of urinary tract calculus,
suspicious lesion or hydronephrosis. The bladder appears
unremarkable for its degree of distention.

Stomach/Bowel: Enteric contrast was administered and has passed in
the distal colon. The stomach appears unremarkable for its degree of
distension. No evidence of bowel wall thickening, distention or
surrounding inflammatory change. Mild diverticulosis of the sigmoid
colon.

Vascular/Lymphatic: There are no enlarged abdominal or pelvic lymph
nodes. Mild aortic tortuosity. No acute vascular findings.

Reproductive: Probable hysterectomy. Tubular low-density structure
in the right adnexa is unchanged from 7719, measuring 4.2 cm in
length on image 41/6, likely a hydrosalpinx. No suspicious adnexal
findings.

Other: Small umbilical hernia containing only fat.  No ascites.

Musculoskeletal: No acute or significant osseous findings. Mild
spondylosis.
IMPRESSION: 1. No acute findings or explanation for the patient's symptoms.
2. Mild sigmoid diverticulosis without acute inflammation.
3. Incidental findings as above, not requiring any specific imaging
follow-up.
